# Patient Record
Sex: Female | Born: 1935 | Race: White | Hispanic: No | Smoking: Never smoker
Health system: Southern US, Community
[De-identification: ages and names within clinical notes are randomized; demographics above are authoritative.]

## PROBLEM LIST (undated history)

## (undated) DIAGNOSIS — I10 Essential (primary) hypertension: Secondary | ICD-10-CM

## (undated) DIAGNOSIS — I4891 Unspecified atrial fibrillation: Secondary | ICD-10-CM

## (undated) HISTORY — PX: FEMUR CLOSED REDUCTION: SHX939

---

## 2005-01-12 ENCOUNTER — Emergency Department: Payer: Self-pay | Admitting: Unknown Physician Specialty

## 2005-08-02 ENCOUNTER — Ambulatory Visit: Payer: Self-pay | Admitting: *Deleted

## 2006-01-07 ENCOUNTER — Ambulatory Visit: Payer: Self-pay | Admitting: *Deleted

## 2006-10-25 ENCOUNTER — Ambulatory Visit: Payer: Self-pay | Admitting: *Deleted

## 2007-11-12 ENCOUNTER — Ambulatory Visit: Payer: Self-pay | Admitting: *Deleted

## 2009-09-24 ENCOUNTER — Inpatient Hospital Stay: Payer: Self-pay | Admitting: Internal Medicine

## 2009-09-30 ENCOUNTER — Inpatient Hospital Stay: Payer: Self-pay | Admitting: Internal Medicine

## 2010-01-13 ENCOUNTER — Ambulatory Visit: Payer: Self-pay | Admitting: Ophthalmology

## 2010-01-30 ENCOUNTER — Ambulatory Visit: Payer: Self-pay | Admitting: Ophthalmology

## 2010-02-08 ENCOUNTER — Ambulatory Visit: Payer: Self-pay | Admitting: Otolaryngology

## 2010-05-25 ENCOUNTER — Inpatient Hospital Stay: Payer: Self-pay | Admitting: *Deleted

## 2010-11-08 ENCOUNTER — Ambulatory Visit: Payer: Self-pay | Admitting: Ophthalmology

## 2010-11-13 ENCOUNTER — Ambulatory Visit: Payer: Self-pay | Admitting: Ophthalmology

## 2012-03-21 ENCOUNTER — Emergency Department: Payer: Self-pay | Admitting: Emergency Medicine

## 2013-05-06 ENCOUNTER — Inpatient Hospital Stay: Payer: Self-pay | Admitting: Internal Medicine

## 2013-05-06 LAB — CBC
HCT: 35.1 % (ref 35.0–47.0)
HGB: 12 g/dL (ref 12.0–16.0)
MCH: 32.8 pg (ref 26.0–34.0)
MCHC: 34.2 g/dL (ref 32.0–36.0)
MCV: 96 fL (ref 80–100)
Platelet: 174 10*3/uL (ref 150–440)
WBC: 4.9 10*3/uL (ref 3.6–11.0)

## 2013-05-06 LAB — TSH: Thyroid Stimulating Horm: 1.33 u[IU]/mL

## 2013-05-06 LAB — COMPREHENSIVE METABOLIC PANEL
Alkaline Phosphatase: 70 U/L (ref 50–136)
Anion Gap: 7 (ref 7–16)
BUN: 19 mg/dL — ABNORMAL HIGH (ref 7–18)
Bilirubin,Total: 0.2 mg/dL (ref 0.2–1.0)
Calcium, Total: 8.8 mg/dL (ref 8.5–10.1)
Chloride: 105 mmol/L (ref 98–107)
Co2: 25 mmol/L (ref 21–32)
Glucose: 91 mg/dL (ref 65–99)
Osmolality: 276 (ref 275–301)
Potassium: 3.9 mmol/L (ref 3.5–5.1)
Sodium: 137 mmol/L (ref 136–145)

## 2013-05-06 LAB — MAGNESIUM: Magnesium: 1.9 mg/dL

## 2013-05-06 LAB — TROPONIN I: Troponin-I: 1.3 ng/mL — ABNORMAL HIGH

## 2013-05-06 LAB — PROTIME-INR: Prothrombin Time: 12.4 secs (ref 11.5–14.7)

## 2013-05-06 LAB — CK TOTAL AND CKMB (NOT AT ARMC): CK-MB: 3.8 ng/mL — ABNORMAL HIGH (ref 0.5–3.6)

## 2013-05-07 LAB — CBC WITH DIFFERENTIAL/PLATELET
Basophil #: 0 10*3/uL (ref 0.0–0.1)
Basophil %: 0.7 %
Lymphocyte #: 1 10*3/uL (ref 1.0–3.6)
Lymphocyte %: 23.3 %
MCHC: 34.3 g/dL (ref 32.0–36.0)
Monocyte %: 14.6 %
Neutrophil %: 58 %
Platelet: 162 10*3/uL (ref 150–440)
RBC: 3.27 10*6/uL — ABNORMAL LOW (ref 3.80–5.20)
RDW: 14.3 % (ref 11.5–14.5)

## 2013-05-07 LAB — LIPID PANEL
Cholesterol: 138 mg/dL (ref 0–200)
Ldl Cholesterol, Calc: 68 mg/dL (ref 0–100)
Triglycerides: 59 mg/dL (ref 0–200)
VLDL Cholesterol, Calc: 12 mg/dL (ref 5–40)

## 2013-05-07 LAB — COMPREHENSIVE METABOLIC PANEL
Albumin: 2.7 g/dL — ABNORMAL LOW (ref 3.4–5.0)
Alkaline Phosphatase: 62 U/L (ref 50–136)
Anion Gap: 6 — ABNORMAL LOW (ref 7–16)
BUN: 17 mg/dL (ref 7–18)
Bilirubin,Total: 0.2 mg/dL (ref 0.2–1.0)
Chloride: 105 mmol/L (ref 98–107)
Co2: 28 mmol/L (ref 21–32)
Creatinine: 1.16 mg/dL (ref 0.60–1.30)
Osmolality: 279 (ref 275–301)
SGPT (ALT): 14 U/L (ref 12–78)
Sodium: 139 mmol/L (ref 136–145)

## 2013-05-07 LAB — TROPONIN I: Troponin-I: 0.75 ng/mL — ABNORMAL HIGH

## 2013-09-28 ENCOUNTER — Emergency Department: Payer: Self-pay | Admitting: Emergency Medicine

## 2015-02-26 ENCOUNTER — Ambulatory Visit: Payer: Self-pay | Admitting: Family Medicine

## 2015-04-01 IMAGING — CR DG CHEST 2V
1 series · 2 of 2 positions shown · non-contrast
Comparison: 05/06/2013

CLINICAL DATA: Slight cough today. Crackles within the left lung
base.

EXAM:
CHEST  2 VIEW

[Series 1: w chest pa · 0.14mm/px · 2 of 2 slices shown]
[im 1/2]
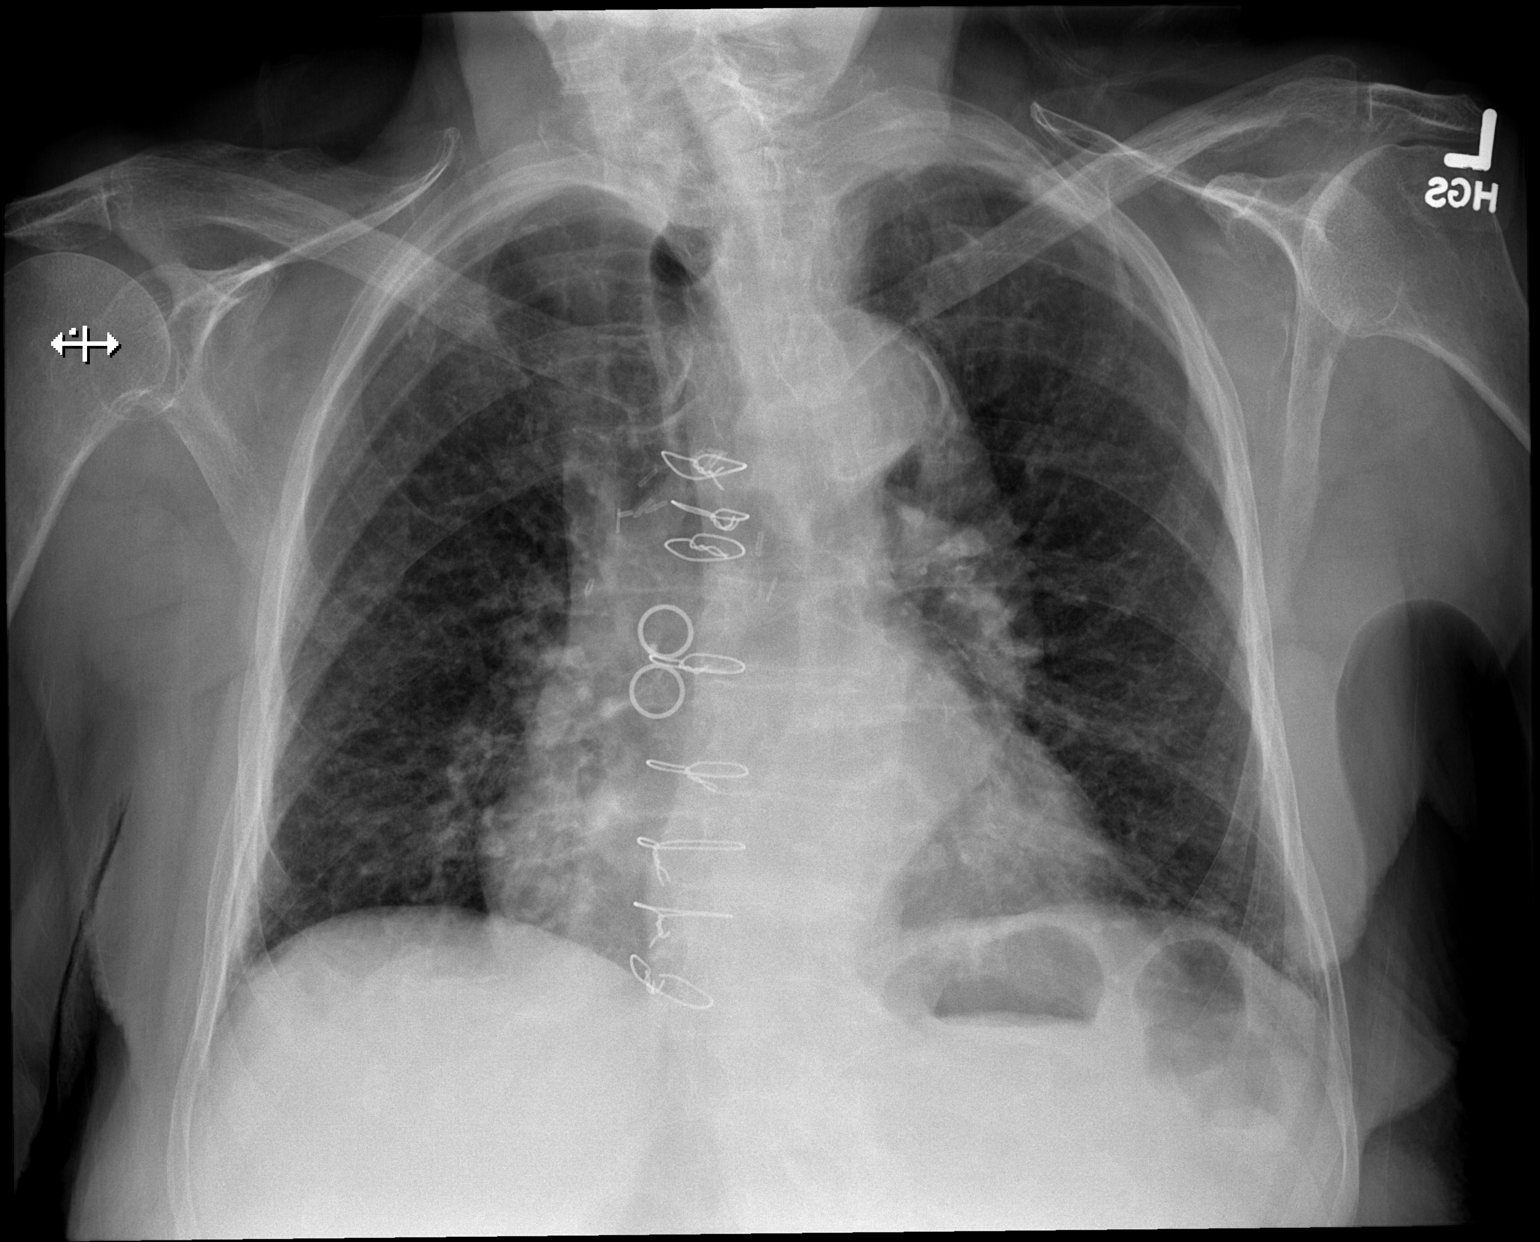
[im 2/2]
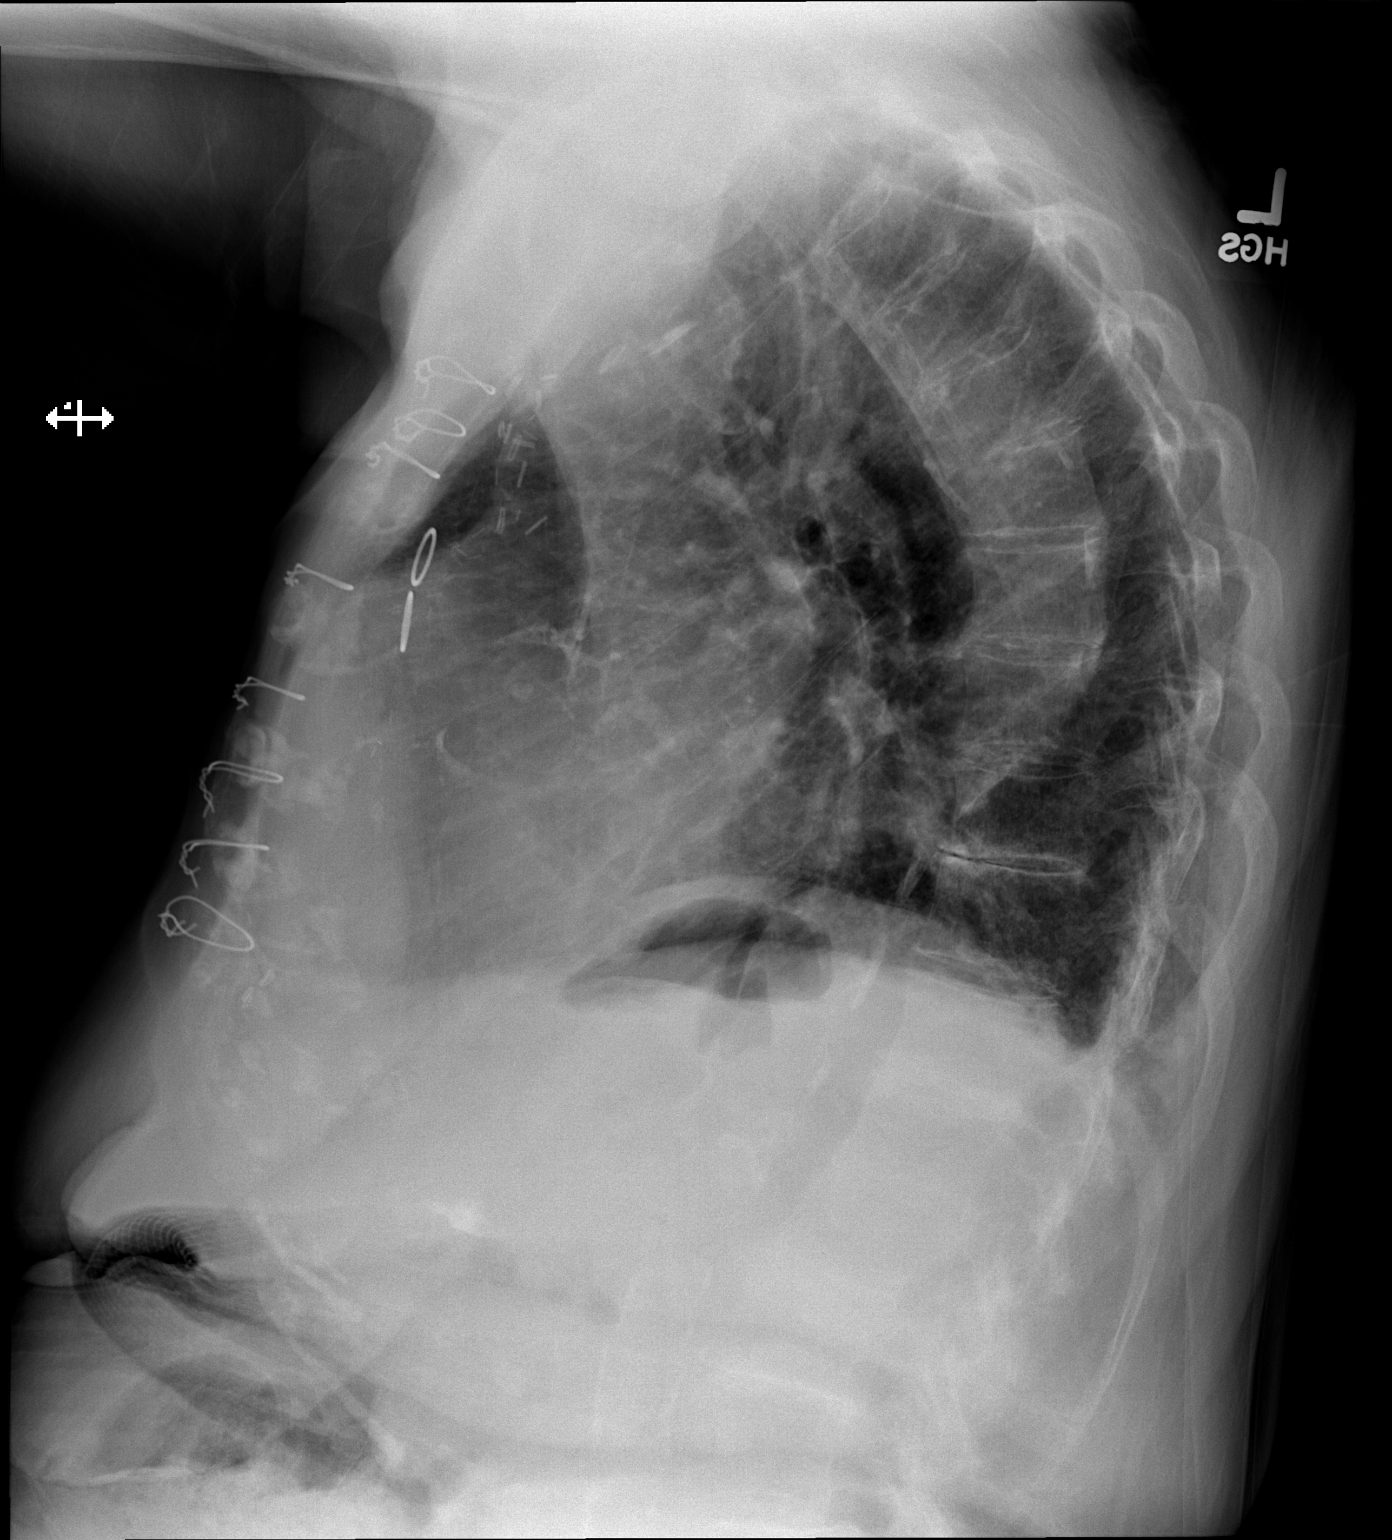

[2 of 2 positions shown; findings below may reference images not displayed]

FINDINGS: Grossly unchanged enlarged cardiac silhouette and mediastinal
contours given reduced lung volumes and patient rotation. Post
median sternotomy and CABG. Atherosclerotic plaque within a tortuous
and possibly ectatic thoracic aorta. Mild pulmonary venous
congestion without frank evidence of edema. Minimal bibasilar
opacities favored to represent atelectasis. No definite pleural
effusion or pneumothorax. Unchanged bones.
IMPRESSION: Cardiomegaly and pulmonary venous congestion without frank evidence
of edema on this hypoventilated examination.

## 2015-04-05 ENCOUNTER — Inpatient Hospital Stay
Admit: 2015-04-05 | Disposition: A | Discharge status: Skilled Nursing Facility | Payer: Self-pay | Attending: Internal Medicine | Admitting: Internal Medicine

## 2015-04-05 LAB — URINALYSIS, COMPLETE
BILIRUBIN, UR: NEGATIVE
Bacteria: NONE SEEN
GLUCOSE, UR: NEGATIVE mg/dL (ref 0–75)
Ketone: NEGATIVE
LEUKOCYTE ESTERASE: NEGATIVE
NITRITE: NEGATIVE
PH: 5 (ref 4.5–8.0)
Protein: NEGATIVE
Specific Gravity: 1.012 (ref 1.003–1.030)

## 2015-04-05 LAB — BASIC METABOLIC PANEL
ANION GAP: 3 — AB (ref 7–16)
Anion Gap: 5 — ABNORMAL LOW (ref 7–16)
BUN: 20 mg/dL
BUN: 19 mg/dL
Calcium, Total: 8.5 mg/dL — ABNORMAL LOW
Calcium, Total: 7.4 mg/dL — ABNORMAL LOW
Chloride: 98 mmol/L — ABNORMAL LOW
Chloride: 100 mmol/L — ABNORMAL LOW
Co2: 31 mmol/L
Co2: 31 mmol/L
Creatinine: 1.31 mg/dL — ABNORMAL HIGH
Creatinine: 1.17 mg/dL — ABNORMAL HIGH
EGFR (Non-African Amer.): 39 — ABNORMAL LOW
GFR CALC AF AMER: 39 — AB
GFR CALC AF AMER: 45 — AB
GFR CALC NON AF AMER: 34 — AB
GLUCOSE: 135 mg/dL — AB
Glucose: 130 mg/dL — ABNORMAL HIGH
Potassium: 3 mmol/L — ABNORMAL LOW
Potassium: 3.6 mmol/L
SODIUM: 134 mmol/L — AB
Sodium: 134 mmol/L — ABNORMAL LOW

## 2015-04-05 LAB — CBC
HCT: 34 % — AB (ref 35.0–47.0)
HGB: 11.2 g/dL — ABNORMAL LOW (ref 12.0–16.0)
MCH: 31.7 pg (ref 26.0–34.0)
MCHC: 33 g/dL (ref 32.0–36.0)
MCV: 96 fL (ref 80–100)
PLATELETS: 160 10*3/uL (ref 150–440)
RBC: 3.54 10*6/uL — ABNORMAL LOW (ref 3.80–5.20)
RDW: 14.4 % (ref 11.5–14.5)
WBC: 7.5 10*3/uL (ref 3.6–11.0)

## 2015-04-05 LAB — PROTIME-INR
INR: 1
Prothrombin Time: 13.4 secs

## 2015-04-05 LAB — MAGNESIUM: Magnesium: 2.1 mg/dL

## 2015-04-05 LAB — APTT: ACTIVATED PTT: 25.7 s (ref 23.6–35.9)

## 2015-04-06 LAB — CBC WITH DIFFERENTIAL/PLATELET
Basophil #: 0 10*3/uL (ref 0.0–0.1)
Basophil %: 0.1 %
Eosinophil #: 0 10*3/uL (ref 0.0–0.7)
Eosinophil %: 0.3 %
HCT: 25 % — AB (ref 35.0–47.0)
HGB: 8.3 g/dL — ABNORMAL LOW (ref 12.0–16.0)
Lymphocyte #: 0.5 10*3/uL — ABNORMAL LOW (ref 1.0–3.6)
Lymphocyte %: 5 %
MCH: 32.6 pg (ref 26.0–34.0)
MCHC: 33.3 g/dL (ref 32.0–36.0)
MCV: 98 fL (ref 80–100)
MONO ABS: 0.8 x10 3/mm (ref 0.2–0.9)
MONOS PCT: 8.2 %
NEUTROS ABS: 8.7 10*3/uL — AB (ref 1.4–6.5)
Neutrophil %: 86.4 %
Platelet: 130 10*3/uL — ABNORMAL LOW (ref 150–440)
RBC: 2.56 10*6/uL — ABNORMAL LOW (ref 3.80–5.20)
RDW: 14.2 % (ref 11.5–14.5)
WBC: 10 10*3/uL (ref 3.6–11.0)

## 2015-04-06 LAB — BASIC METABOLIC PANEL
ANION GAP: 5 — AB (ref 7–16)
BUN: 19 mg/dL
CALCIUM: 8 mg/dL — AB
CHLORIDE: 102 mmol/L
CO2: 28 mmol/L
Creatinine: 1.12 mg/dL — ABNORMAL HIGH
GFR CALC AF AMER: 47 — AB
GFR CALC NON AF AMER: 41 — AB
Glucose: 146 mg/dL — ABNORMAL HIGH
Potassium: 3.6 mmol/L
Sodium: 135 mmol/L

## 2015-04-07 LAB — HEMOGLOBIN: HGB: 7.5 g/dL — ABNORMAL LOW (ref 12.0–16.0)

## 2015-04-07 LAB — PLATELET COUNT: Platelet: 137 10*3/uL — ABNORMAL LOW (ref 150–440)

## 2015-04-08 LAB — CBC WITH DIFFERENTIAL/PLATELET
Basophil #: 0 10*3/uL (ref 0.0–0.1)
Basophil %: 0.1 %
EOS PCT: 0 %
Eosinophil #: 0 10*3/uL (ref 0.0–0.7)
HCT: 24 % — ABNORMAL LOW (ref 35.0–47.0)
HGB: 7.8 g/dL — ABNORMAL LOW (ref 12.0–16.0)
Lymphocyte #: 0.4 10*3/uL — ABNORMAL LOW (ref 1.0–3.6)
Lymphocyte %: 3.8 %
MCH: 31.7 pg (ref 26.0–34.0)
MCHC: 32.6 g/dL (ref 32.0–36.0)
MCV: 97 fL (ref 80–100)
MONO ABS: 0.8 x10 3/mm (ref 0.2–0.9)
Monocyte %: 8.4 %
NEUTROS ABS: 8.8 10*3/uL — AB (ref 1.4–6.5)
Neutrophil %: 87.7 %
Platelet: 173 10*3/uL (ref 150–440)
RBC: 2.46 10*6/uL — ABNORMAL LOW (ref 3.80–5.20)
RDW: 14.6 % — ABNORMAL HIGH (ref 11.5–14.5)
WBC: 10 10*3/uL (ref 3.6–11.0)

## 2015-04-08 NOTE — Discharge Summary (Signed)
PATIENT NAME:  Emily Henry, Emily Henry MR#:  [id number] DATE OF BIRTH:  11/01/1915  DATE OF ADMISSION:  05/06/2013 DATE OF DISCHARGE:  05/07/2013  ADMITTING DIAGNOSIS: Atrial fibrillation, new diagnosis.  DISCHARGE DIAGNOSES:  1.  Atrial fibrillation, rapid ventricular response, converted to sinus rhythm now. Suspected paroxysmal atrial fibrillation.  2.  Elevated troponin, likely due to atrial fibrillation. Per cardiology, not myocardial infarction.  3.  Hypertension. 4.  Hyperlipidemia with LDL of 68.  5.  Generalized weakness.  6.  Valvular heart disease.  Echo this admission showed mild left ventricular hypertrophy, mild mitral regurgitation, and moderate to severe tricuspid regurgitation. Grade I diastolic dysfunction.   DISCHARGE CONDITION: Stable.   DISCHARGE MEDICATIONS: The patient is to resume her outpatient medications which are:  1.  Nexium 20 p.o. daily. 2.  Pravastatin 20 mg p.o. at bedtime. 3.  Iron sulfate 325 mg p.o. twice daily. 4.  Vitamin D2 50,000 one capsule once weekly. 5.  Enalapril 20 mg p.o. twice daily, new medication. 6.  Amiodarone 400 mg p.o. once daily. 7.  Metoprolol 25 mg p.o. twice daily. 8.  Latanoprost 0.005% ophthalmic solution 1 drop to each affected eye at bedtime. 9.  Brimonidine ophthalmic solution 1 drop to each affected eye once at bedtime.   HOME OXYGEN: None.   DIET: 2 gram salt, low fat, low cholesterol, mechanical soft.   ACTIVITY LIMITATIONS: As tolerated.    DISCHARGE FOLLOWUP:  Appointment with Dr. Adrian Blackwater on Thursday of next week, in the next 1 to 2 days, as well as [facility] in 2 to 3 days after discharge.  CONSULTANTS: Care management, Dr. Adrian Blackwater, and Ms. [facility] [place].   RADIOLOGIC STUDIES: Chest x-ray, PA and lateral, on 21st of May 2014, showed no evidence of CHF or pneumonia or other acute cardiopulmonary abnormality.  Nodularity in the retrocardiac region, on the lateral firm, likely reflects (Dictation Anomaly)  a pulmonary vesel on end, but a parenchymal nodule is not absolutely excluded. Follow-up PA and lateral with deep inspiration would be of value, according to radiology.  Echocardiogram, on 21st of May 2014, showed left ventricular ejection fraction by visual estimation of 65% to 70%, mild left ventricular hypertrophy, decreased left ventricular internal cavity size, moderately dilated left atrium, mild mitral valve regurgitation, moderate to severe tricuspid regurgitation, and grade I diastolic dysfunction.   HISTORY OF PRESENT ILLNESS:  The patient is 81 year old Caucasian female with past medical history significant for history of hypertension as well as hyperlipidemia who presented to the hospital with palpitations. Please refer to Dr. Arlys John admission note on the 21st of May 2014.   On arrival to the hospital, the patient's temperature was 97.6, pulse was 80, respiration rate was 20, blood pressure 136/64, and saturation was 96% on room air. Physical examination was unremarkable.   The patient's EKG showed A-fib with rate of 96, anteroseptal infarct, age indeterminant, and no acute ST-T changes were noted. The patient's lab data done on the 21st of May 2014 revealed mild elevation of BUN to 19, otherwise BMP was unremarkable. The patient's liver enzymes revealed albumin level of 3.1, otherwise unremarkable. First set of cardiac enzymes revealed mild elevation of troponin to 0.16 and second set 1.3 with CK-MB fraction elevation to 3.8.  On the third set, normal MB fraction and troponin level was 0.75. TSH was normal at 1.33. White blood cell count was 4.9, hemoglobin 12.0, and platelet count 174. Coagulation panel was unremarkable. The patient's chest x-rays were unremarkable.  HOSPITAL COURSE:  The patient was admitted to the hospital for further evaluation, for observation. Her cardiac enzymes were cycled and she was started on beta blocker, metoprolol. The [facility] as well as Dr. Adrian BlackwaterShaukat Khan. Dr. Welton FlakesKhan, cardiologist, felt the patient had A-fib with rapid ventricular response. The patient has a history of paroxysmal atrial fibrillation which usually resolves with rest; however, it was persistent on the day of admission and converted to sinus rhythm with Cardizem IV. As the patient had  CHADS score of 2, cardiology recommended long-term anticoagulation in the future. Due to the fact the patient was having upcoming surgery on the arm, we deferred starting this until after surgery is completed. The patient was started on antiarrhythmic medication, amiodarone 400 mg once daily dose, with the plan of tapering it to 200 mg daily.  In regards to coronary artery disease, the patient has a history of coronary artery bypass grafting x 3. She had no chest pains or chest pressure or jaw pain; however, had some dyspnea on exertion.  Her troponin was elevated and cardiology felt it could be related to A-fib or anemia versus coronary ischemia. They recommended echocardiogram to check for wall motion abnormality, also to rule out any thrombus. This was performed and no wall motion abnormalities were found or any thrombus was found, as mentioned above.   In regards to hypertension, the patient's blood pressure was noted to be somewhat elevated.  They recommended to start the patient on amlodipine in addition to ACE inhibitor as well as beta blocker.   In regards to hyperlipidemia, the patient needs to continue statin and LDL was felt to be at goal.  In regards to hypertension, the patient was started on beta blocker and her ACE inhibitor was advanced.  The patient's blood pressure somewhat improved, however, not significantly so. By the day of discharge, the 22nd of May 2014, the patient's temperature was normal at 98, pulse was 66, respiration rate was 16 to 18, and blood pressure still ranging from 150s to 160s systolic and 70s to 80s diastolic. O2 sats were 96% to 97% on room  air at rest. It is recommended to follow the patient's blood pressure readings as outpatient and make decisions about advancement of her blood pressure medications even further if needed.   The patient is being discharged in stable condition with above-mentioned medications and follow-up. Of note, the patient had comparison basic metabolic panel done which was unremarkable. The patient also had CBC repeated on the 27th of May 2014 which revealed some anemia with hemoglobin level of 10.8. It is recommended to follow the patient's hemoglobin level as needed. No active bleeding was noted on the day of discharge.   TIME SPENT: 40 minutes. ____________________________ Katharina Caperima Mckenleigh Tarlton, MD rv:sb D: 05/08/2013 14:06:13 ET T: 05/08/2013 15:25:35 ET JOB#: [id number]  cc: Katharina Caperima York Valliant, MD, <Dictator> [facility] Shoua Ressler MD ELECTRONICALLY SIGNED 05/24/2013 18:18

## 2015-04-08 NOTE — Consult Note (Signed)
Brief Consult Note: Diagnosis: a fib wth RVR.   Patient was seen by consultant.   Consult note dictated.   Orders entered.   Comments: Patient with palpitations, fast HR, weakness and found to have a fib with RVR. Patient apparently has h/o paroxysmal afib, h/o CAD, HTN, valvular disease. She converted to NSR with Cardizem and  denies CP, chest pressure, jaw pain at this time. TNI elevated, but could be from demand ischemia from tachycardia/a fib vs. coronary ischemia. Will get echo to check for wall motion abnormalities before proceeding with more invasive testing and patient denies CP, chest pressure, and is largely asymptomatic at this time. Since she has CHADS2 score of 2 and does not have any fall history, would recommend long-term anticoagulation therapy. (she does have surgery planned for next week). Agree with heparin, ACE-I, statin, and will start anti-arrhythmics.  Electronic Signatures: Radene KneeKhan, Shaukat Ali (MD)   (Signed 22-May-14 09:02)  Co-Signer: Brief Consult Note Verta EllenManzi, Theresea Trautmann A (PA-C)   (Signed 22-May-14 08:17)  Authored: Brief Consult Note  Last Updated: 22-May-14 09:02 by Radene KneeKhan, Shaukat Ali (MD)

## 2015-04-08 NOTE — Consult Note (Signed)
PATIENT NAME:  Emily Henry, Emily Henry MR#:  [id number] DATE OF BIRTH:  [birth year]  DATE OF CONSULTATION:  05/07/2013  REFERRING PHYSICIAN:  Katharina Caper, MD CONSULTING PHYSICIAN:  Verta Ellen, PA-C  PRIMARY CARE PHYSICIAN: [facility].  REASON FOR CONSULTATION: Atrial fibrillation with rapid ventricular response.   HISTORY OF PRESENT ILLNESS: Ms. Emily Henry is a pleasant 81 year old white female who lives independently on her own. She has a history of hypertension, coronary artery disease requiring coronary artery bypass grafting x 3 (10 years ago at [facility]), history of hyperlipidemia, valvular heart disease, and possible transient ischemic attack during admission in June 2011. The patient states she has had intermittent palpitations and rapid heart rate which usually resolve with rest. Yesterday, however, she complained of her heart racing and her symptoms would not go away. She was brought to the Emergency Department where she was found to have atrial fibrillation with rapid ventricular response. She denied any chest pain, chest pressure, heaviness, jaw pain, orthopnea, coughing, wheezing, or fainting. She has some chronic shortness of breath that is worse with exertion, but denies any orthopnea or significant leg edema. She received diltiazem and converted into normal sinus rhythm.   PAST MEDICAL HISTORY: 1.  Transient ischemic attack per report from June 2011.  2.  Hypertension.  3.  Coronary artery disease requiring coronary artery bypass grafting x 3 at [facility] approximately 10 years ago.  4.  Hyperlipidemia.  5.  Valvular heart disease (the patient thinks it may be tricuspid regurgitation).  6.  Anemia, the patient is currently on iron supplementation.  7.  Gastroesophageal reflux disease with history of hiatal hernia.  8.  Glaucoma.  9.  Basal cell carcinoma on the left forearm (the patient is scheduled for removal next week).    PAST SURGICAL HISTORY: 1.  Coronary artery bypass grafting x 3 ten years ago at [facility].   2.  Skin cancer on the left side of the face. 3.  Eye surgery.  4.  Appendectomy.   ALLERGIES: No known drug allergies.   HOME MEDICATIONS: 1.  Amlodipine 5 mg p.o. daily.  2.  Celebrex 200 mg p.o. daily.  3.  Enalapril 10 mg p.o. at bedtime.  4.  Aspirin 81 mg p.o. daily.  5.  Iron sulfate 325 mg p.o. b.i.d.  6.  Nexium 20 mg 1 tablet p.o. daily.  7.  Atorvastatin 10 mg p.o. daily.  8.  Vitamin D 50,000 units once weekly.   SOCIAL HISTORY: The patient lives independently and has family members that live nearby. She is widowed. Denies any smoking, alcohol, or illicit drug use.   FAMILY HISTORY: Mother with congestive heart failure.  Son with basal cell carcinoma and hypertension. Father with kidney failure.   REVIEW OF SYSTEMS: GENERAL: The patient complains of weakness and fatigue.  HEENT: The patient complains of blurring vision, uses bifocal glasses, has seasonal allergies with sneezing.  PULMONARY: The patient complains of shortness of breath that is worse with exertion.  CARDIOVASCULAR: The patient complains of palpitations and rapid heart rate.  GASTROINTESTINAL: The patient denies any nausea and vomiting. However, she does have some history of gastroesophageal reflux disease.  PHYSICAL EXAMINATION: GENERAL: This is a pleasant female who is not in any acute distress. She is alert and oriented x 3.  Able to answer questions appropriately.  VITAL SIGNS: Temperature 98 degrees Fahrenheit, heart rate 66, respiratory rate 18, blood pressure 160/75, and O2 sat is  97% on room air.  HEENT:  Head:  atraumatic, normocephalic. Eyes:  Pupils are round and equal, pale conjunctivae. Ears and nose:  Are normal to external inspection. Mouth:  Moist mucous membranes.  NECK: Supple. Trachea is midline. Thyroid smooth. No carotid bruits.  LUNGS: Clear to auscultation bilaterally,  no adventitious breath sounds, no accessory muscle use.  HEART:  Regular rate and rhythm, has a murmur loudest over the apex that is systolic, grade 4/6.   ABDOMEN: Soft and nontender. Bowel sounds are present in all 4 quadrants. No hepatosplenomegaly.  EXTREMITIES: No cyanosis, clubbing, or edema.   ANCILLARY DATA: EKG on admission with atrial fibrillation, 94 beats per minute, nonspecific ST and T wave changes.   Chest x-ray:  No definite evidence of CHF or pneumonia, nodularity in the retrocardiac region on the lateral film that could be pulmonary vessel versus parenchymal nodule.   LABORATORY DATA: Glucose 89, BUN 17, creatinine 1.16, sodium 139, potassium 4.1, chloride 105, and CO2 28.  Total cholesterol 138, triglycerides 59, HDL 58, and LDL 68.  Total protein 6.3, albumin 2.7, total bilirubin 0.2, alkaline phosphatase 62, AST 27, and ALT 17. Total CK 191, CK-MB 2.7, troponin I were 0.16, 1.30, and 0.75. TSH is 1.33. White blood cell count 4.3, hemoglobin 10.8, hematocrit 31.5, and platelet count 162,000. PTT is greater than 160. PT 12.4 and INR 0.9.   ASSESSMENT AND PLAN: 1.  Atrial fibrillation with rapid ventricular response. The patient has a history of paroxysmal atrial fibrillation which usually resolves with rest, however, it was persistent yesterday and converted to normal sinus rhythm with Cardizem. She is currently in normal sinus rhythm. She has a CHADS2 score of 2 and would recommend long-term anticoagulation in the future. Due to the fact that the patient is having upcoming surgery on her arm, may want to defer starting this till surgery is completed. Will start the patient on antiarrhythmic, amiodarone 400 mg q. day with the plan of tapering it to 200 mg daily.  2.  Coronary artery disease. The patient has a history of coronary artery disease status post coronary artery bypass grafting x 3. She denies any chest pain, chest pressure, or jaw pain, but does have some dyspnea on  exertion. Troponin I elevated, but this could be secondary to demand ischemia from tachycardia secondary to atrial fibrillation or anemia versus coronary ischemia. We will get echocardiogram to check wall motion abnormality and also to rule out any thrombi.  3.  Hypertension. The patient's blood pressure is elevated.  Will restart amlodipine, in addition to her ACE inhibitor and beta blocker.  4.  Hyperlipidemia. The patient is currently on statin and LDL is at goal.   Thank you very much for allowing us to participate in this patient's care. We will continue to follow this patient with you. ____________________________ Verta EllenMonica A. Idali Lafever, PA-C mam:sb D: 05/07/2013 08:34:37 ET T: 05/07/2013 08:48:47 ET JOB#: [id number]  cc: Verta EllenMonica A. Uzziah Rigg, PA-C, <Dictator> [facility] Katharina Caperima [place], MD Herman Fiero A [facility] PA ELECTRONICALLY SIGNED 05/07/2013 11:12

## 2015-04-08 NOTE — H&P (Signed)
PATIENT NAME:  Emily FilbertBOWLAND, Glanda F MR#:  [id number] DATE OF BIRTH:  07/09/1915  DATE OF ADMISSION:  05/06/2013  PRIMARY CARE PHYSICIAN: [facility]   CARDIOLOGIST: The patient is not sure if Dr. Juel BurrowMasoud or Dr. Adrian BlackwaterShaukat Khan.   The patient is a 81 year old Caucasian female with past medical history significant for history of hypertension, labile hypertension, history of coronary artery disease, history of hyperlipidemia, valvular heart disease, a history of transient ischemic attack admission in June 2011, who presents to the hospital with complaints of palpitations.  Per the patient, she was doing well until approximately 2 or 3 days ago when she started having morning time sickness.  She states that she wakes up and then after she wakes up around 5 or 6:00 a.m. she usually does not feel well. She feels very weak, very tired and she feels that she has some palpitations. She had to sit down to regain some strength and usually those palpitations disappear in the next few hours after she takes her medications.  Today, however, she was having some shortness of breath as well as some chest tightness, so she decided to come to the Emergency Room for further evaluation. In the Emergency Room, she was found to be in atrial fibrillation.  Her heart rate was 94 reported on EKG; however, reported atrial fibrillation, RVR according to the Emergency Room physician on flow sheet and she was noted to have heart rate of 80; however, she received 5 mg of Cardizem after to her usual home medications including amiodarone, she is back in sinus rhythm. She feels quite comfortable now. She denies any chest pains.   PAST MEDICAL HISTORY: Significant for history of admission in June 2011 with transient ischemic attack, also a history of labile hypertension, malignant hypertension, coronary artery disease. Hyperlipidemia. Valvular heart disease and, status post coronary artery bypass graft surgery approximately 10 years ago ,  glaucoma and postnasal drip and also recent diagnosis of basal cell carcinoma in the left forearm biopsy done approximately a week ago. The patient is scheduled for removal of her basal carcinoma.   PAST SURGICAL HISTORY: Coronary artery bypass grafting, cancer in the left side of the face removal, and eye surgery.   ALLERGIES: No known drug allergies.   MEDICATIONS: According to medical records, the patient is on amlodipine 5 mg p.o. daily, Celebrex 200 mg p.o. daily, enalapril 10 mg p.o. at bedtime, iron sulfate 325 mg p.o. twice daily, Nexium 20 mg p.o. once daily, atorvastatin 10 mg p.o. daily, vitamin D 50,000 units once weekly.  In the past, she was also on Xalatan at bedtime, as well as (Dictation Anomaly)Alphagan to both eyes  twice daily.   SOCIAL HISTORY: No smoking or alcohol. She lives by herself at home. She is widowed.   FAMILY HISTORY: The patient's mother died at age of 79 with multiple medical problems. The patient's father lived up until his 7790s and died of kidney failure.  Mother had congestive heart failure, also glaucoma in family.  Basal cell carcinoma in patient's son and hypertension in multiple family members.   SOCIAL HISTORY: The patient is widowed, has three children who live close by. No alcohol abuse. No smoking. She used to work in a [employer] for 40 years.  REVIEW OF SYSTEMS:  Positive for feeling cold for a while now, fatigue and weak for the past few months.  (Dictation Anomaly)Per  the patient's son, she had some blurring of vision, she is on the border of glaucoma, also  bifocal glasses. She has some seasonal allergies. Some sneezing intermittently and  shortness of breath today as well as palpitations earlier today. Admits of having some lower extremity edema around her ankles at the end of the day; however, the patient's swelling dissipates in the morning.  Admits to having some achiness, for the past few days, although the patient's son tells me that she has  been weak for the past few months now. Admits of having some fluttering. She has to sit down. And that suddenly goes away in the next few hours. She wakes up usually at 6:00 a.m. and then her fluttering dissipates at around 10:00 a.m.  She admits of having some heaviness in the chest as well as some shortness of breath. Admits of having some pain between her shoulder blades intermittently.  Admits to having some nausea here in the emergency room.  Also, urinary urge incontinence and needs to wake up at nighttime to urinate approximately 1 to 2 times at night.   CONSTITUTIONAL: Denies any fevers, pains, weight loss or gain.   EYES: Denies any double vision denies any tinnitus, allergies, epistaxis, sinus tenderness or difficulty swallowing. Respiratory, no cough, wheezes, asthma or (Dictation Anomaly)COPD  . Admits of having some pains between shoulder blades.   GI,   denies any vomiting, diarrhea or constipation.   GENITOURINARY: Denies any dysuria, hematuria, frequency.   ENDOCRINOLOGY:No polydispia, polyuria  She is not sure about heat  intolerance or thirst.   HEMATOLOGIC: Denies anemias, (Dictation Anomaly)easy bruising, bleeding.  SKIN:  Denies any acne, rashes, lesions or change in moles.  Admits to having some growth on the left forearm for which biopsy is done and she is scheduled for removal of this growth which was diagnosed as basal cell carcinoma. Denies any arthritis, cramps, swelling, gout.   NEUROLOGIC: No numbness, epilepsy or tremor.   PSYCHIATRIC: Denies anxiety, insomnia or depression.   PHYSICAL EXAMINATION: VITAL SIGNS: On arrival to the hospital, temperature is 97.6, pulse was 80, respiratory rate was 20, blood pressure 136/64, saturation was 96% on room air.   GENERAL: This is a well-developed, well-nourished, thin, frail, Caucasian female in no significant distress sitting on the stretcher.  Pupils are equal, reactive to light.  Extraocular muscles intact.  No icterus  or conjunctivitis. Has normal hearing. No pharyngeal erythema. Mucosa is moist.   NECK: No masses. Supple, nontender. Thyroid is not enlarged. No adenopathy. No JVD or carotid bruits.  Good range of motion.   LUNGS: Clear to auscultation all fields. No rales or findings of diminished breath sounds or wheezing.  Nonlabored respirations.  (Dictation Anomaly)No dullness  to percussion or acute respiratory distress.   CARDIOVASCULAR: S1, S2 appreciated. Murmur was heard approximately 4 out of 6 in the precordial area radiating to her left  axilla.  PMI not lateralized. Chest is nontender to palpitation. Diminished pedal pulses.  No lower extremity edema, calf tenderness or cyanosis was noted.  The patient's rhythm was regular.   ABDOMEN: Soft, nontender. Bowel sounds are present. No hepatosplenomegaly or masses were noted.   RECTAL: Deferred.   MUSCLE STRENGTH: Able to move all extremities. No cyanosis. The patient does have significant kyphosis. Gait is not tested.   SKIN: Revealed malignant looking growth on the left forearm, which also had some bruising around it.  No nodularity or erythema was noted. The patient does have brownish discoloration in her lower extremities as well as thinning of her skin; however, no other wounds or lesions were noted.  LYMPHATIC: No adenopathy in the cervical region.   NEUROLOGICAL: Cranial nerves grossly intact. Sensory is intact. No dysarthria.  The patient is alert to time, person, place, cooperative.  Memory good.  No confusion, agitation or depression noted.   LABORATORY DATA: BMP showed elevated BUN of 19, glucose 91, otherwise BMP was unremarkable. Magnesium level was normal at 1.9. Liver enzymes: Albumin level of 3.1, otherwise unremarkable. Cardiac enzymes were normal except for troponin level is mildly elevated at 0.16. TSH was normal at 1.33.  White blood cell count is normal at 4.9, hemoglobin 12.0, platelet count 174. Coagulation panel was  unremarkable. EKG showed atrial fibrillation at a rate of 94 beats per minute, anteroseptal infarct age indeterminant, no acute ST-T changes, however, were noted.   RADIOLOGIC STUDIES: Chest x-ray, PA and lateral, on 05/06/2013 showed no definite evidence of congestive heart failure or pneumonia or other acute cardiopulmonary abnormality. Nodularity in the retrocardiac region on the lateral film likely reflects pulmonary vessels at the end with a parenchymal nodule is not absolutely excluded. Follow up PA and lateral with deep inspirations would be of value, according to radiologist.   ASSESSMENT AND PLAN: 1. Atrial fibrillation, admit the patient to the medical floor. Start her on metoprolol.  We'll be watching her blood pressure very carefully. We will also get cardiologist involved and we are not going to initiate amiodarone at this time yet.   2. (Dictation Anomaly)Elevated  troponin with chest tightness, shortness of breath could be acute coronary syndrome (Dictation Anomaly)equivalent  . The patient is status post coronary artery bypass grafting; however, that was approximately 10 years ago. The patient has not had recent cardiac evaluation.  A cardiology consultation will be requested and start beta blockers, aspirin, nitroglycerin as well as heparin IV for now. We will check cardiac enzymes x 3. We will get echocardiogram done as well as lipid panel.  3. Hypertension. Will add beta blockers, continue ACE inhibitors at nighttime.   4. Basal cell skin carcinoma from left forearm. The patient is scheduled for removal next week.  5. Hyperlipidemia. Will check lipid panel in the morning. We will continue outpatient medications.   TIME SPENT: 50 minutes    ____________________________ Katharina Caper, MD rv:[id number] D: 05/06/2013 13:19:52 ET T: 05/06/2013 14:12:33 ET JOB#: [id number]  cc: Katharina Caper, MD, <Dictator> [facility]  Demetries Coia MD ELECTRONICALLY SIGNED 05/24/2013 18:54

## 2015-04-11 DIAGNOSIS — I240 Acute coronary thrombosis not resulting in myocardial infarction: Secondary | ICD-10-CM

## 2015-04-11 DIAGNOSIS — I10 Essential (primary) hypertension: Secondary | ICD-10-CM | POA: Diagnosis not present

## 2015-04-11 DIAGNOSIS — I480 Paroxysmal atrial fibrillation: Secondary | ICD-10-CM | POA: Diagnosis not present

## 2015-04-11 DIAGNOSIS — S7292XD Unspecified fracture of left femur, subsequent encounter for closed fracture with routine healing: Secondary | ICD-10-CM | POA: Diagnosis not present

## 2015-04-11 DIAGNOSIS — I2510 Atherosclerotic heart disease of native coronary artery without angina pectoris: Secondary | ICD-10-CM | POA: Diagnosis not present

## 2015-04-13 DIAGNOSIS — T8189XA Other complications of procedures, not elsewhere classified, initial encounter: Secondary | ICD-10-CM | POA: Diagnosis not present

## 2015-04-13 DIAGNOSIS — S72012A Unspecified intracapsular fracture of left femur, initial encounter for closed fracture: Secondary | ICD-10-CM | POA: Diagnosis not present

## 2015-04-17 NOTE — H&P (Signed)
PATIENT NAME:  Emily Henry, Emily Henry MR#:  [id number] DATE OF BIRTH:  [birth year]  DATE OF ADMISSION:  04/05/2015  CHIEF COMPLAINT:  Fall.   HISTORY OF PRESENT ILLNESS:  This is a 81 year old female who had an unwitnessed fall at home. She was by herself. She states that she got up and went to the bathroom and was coming back to bed, went to get in the bed, fell, and fractured her left hip. She was brought in to the ED for evaluation and was found to have the left hip fracture. Orthopedics was called and said that they would see her later this morning to evaluate her for treatment for that fracture, so the hospitalists were called for admission. The patient states that she currently is not hurting. She was treated with some morphine in the ED and that is controlling her pain at this time.   PRIMARY CARE PHYSICIAN:  Laurier NancyShaukat A. Khan, MD   PAST MEDICAL HISTORY:  Hypertension, atrial fibrillation, CAD status post MI and CABG, irritable bowel syndrome, skin cancer of the face, heart murmur, hyperlipidemia, arthritis, TIA.   CURRENT MEDICATIONS:  Vitamin D 50,000 units weekly, pravastatin 20 mg daily, latanoprost eyedrops daily, Lasix 20 mg daily, enalapril 10 mg b.i.d., Celebrex 200 mg daily, aspirin 81 mg daily, amiodarone 200 mg daily, brimonidine eyedrops daily.   PAST SURGICAL HISTORY:  Coronary artery bypass grafting.   ALLERGIES:  No known drug allergies.   FAMILY HISTORY:  Hypertension, kidney disease, congestive heart failure.   SOCIAL HISTORY:  The patient is a nonsmoker. Denies alcohol or illicit drug use.   REVIEW OF SYSTEMS:  CONSTITUTIONAL:  Denies fever, fatigue, or weakness.  EYES:  Denies blurred or double vision, pain, or redness.  EAR, NOSE, AND THROAT:  Denies ear pain. Has chronic hearing loss. Denies difficulty swallowing.  RESPIRATORY:  Denies cough, dyspnea, or painful respirations.  CARDIOVASCULAR:  Denies chest pain, edema, or palpitations.  GASTROINTESTINAL:  Denies  nausea, vomiting, diarrhea, abdominal pain, or constipation.  GENITOURINARY:  Denies dysuria, hematuria, or frequency.  ENDOCRINE:  Denies nocturia, thyroid problems, or heat or cold intolerance.  HEMATOLOGIC AND LYMPHATIC:  Denies easy bruising or bleeding or swollen glands.  INTEGUMENTARY:  Denies acne, rash, or lesion.  MUSCULOSKELETAL:  Denies acute arthritis, joint swelling, or gout. Does endorse left hip joint pain and fracture there.  NEUROLOGICAL:  Denies numbness, weakness, or headache.  PSYCHIATRIC:  Denies anxiety, insomnia, or depression.   PHYSICAL EXAMINATION: VITAL SIGNS:  Blood pressure 131/53, pulse 59, temperature 97.7, respiratory rate 12 with 94% O2 saturations on 2 liters of supplemental oxygen.  GENERAL:  This is a very frail, elderly-appearing woman lying supine in bed in no acute distress.  HEENT:  Pupils are equal, round, and reactive to light and accommodation. Extraocular movements are intact. No scleral icterus. Moist mucosal membranes.  NECK:  Thyroid is not enlarged. Neck is supple. No masses. Nontender. No cervical adenopathy. No JVD.  RESPIRATORY:  Clear to auscultation bilaterally. No rales, rhonchi, or wheezing. No respiratory distress.  CARDIOVASCULAR:  Regular rate and rhythm. She has a 2/6 systolic murmur heard loudest at the right upper sternal border. Good pedal pulses. No lower extremity edema.  ABDOMEN:  Soft, nontender, and nondistended with good bowel sounds.  MUSCULOSKELETAL:  She has full spontaneous range of motion throughout except for her left lower extremity, where she has a hip fracture. She has 5/5 muscular strength in the other 3 extremities. No cyanosis or clubbing. The left lower extremity  has a deformity with the foot deviated laterally. SKIN:  No rash or lesions. Skin is warm, dry, and intact.  LYMPHATIC:  No adenopathy.  NEUROLOGICAL:  Cranial nerves are intact. Sensation is intact throughout. No dysarthria or aphasia.  PSYCHIATRIC:  She  is alert and oriented, cooperative, not confused or agitated.   LABORATORY DATA:  White count is 7.5, hemoglobin 11.2, hematocrit 34, and platelets are 160,000. Sodium is 134, potassium 3.0, chloride 98, bicarbonate 31, BUN 20, creatinine 1.31, glucose 135.   RADIOLOGIC DATA:  Results are pending. Left hip fracture preliminarily seen on radiographic imaging.   ASSESSMENT AND PLAN: 1.  Left hip fracture. Orthopedics has agreed to see this patient later on this morning. In the meantime, hospitalists will admit her. We will keep her with good pain control and follow orthopedics' recommendations in terms of her left hip fracture.  2.  Heart murmur. Given the physical exam findings, this is presumed aortic stenosis. We will get an echocardiogram just to evaluate this murmur, especially as her fall was unwitnessed and seems mechanical, but just to insure that her heart murmur is not an aortic stenosis that may have contributed.  3.  Hypertension. This is a chronic stable problem. We will continue her home medications for this here in the hospital.  4.  Atrial fibrillation. Again, this is a chronic stable problem. We will continue her on amiodarone, which is her home medication for rate control.  5.  Coronary artery disease. We will continue her appropriate home medications for this.  6.  Hyperlipidemia. Continue her statin while here in the hospital.  7.  Deep vein thrombosis prophylaxis with subcutaneous heparin.   CODE STATUS:  This patient is DO NOT RESUSCITATE.   TIME SPENT ON THIS ADMISSION:  45 minutes.    ____________________________ Candace Cruise. Anne Hahn, MD dfw:[id number] D: 04/05/2015 04:15:57 ET T: 04/05/2015 06:03:03 ET JOB#: [id number]  cc: Candace Cruise. Anne Hahn, MD, <Dictator> Tylea Hise Scotty Court MD ELECTRONICALLY SIGNED 04/06/2015 0:54

## 2015-04-17 NOTE — Op Note (Signed)
PATIENT NAME:  Emily FilbertBOWLAND, Emily F MR#:  [id number] DATE OF BIRTH:  September 23, 1915  DATE OF PROCEDURE:  04/05/2015  PREOPERATIVE DIAGNOSIS:  Left comminuted, displaced intertrochanteric hip fracture.   POSTOPERATIVE DIAGNOSIS:  Left comminuted, displaced intertrochanteric hip fracture.   PROCEDURE:  Intramedullary fixation of left intertrochanteric hip fracture.   SURGEON:  Juanell FairlyKevin Prajwal Fellner, MD.   ANESTHESIA:  Spinal.   ESTIMATED BLOOD LOSS:  100 mL.   COMPLICATIONS:  None.   IMPLANTS:  [employer] Affixus 9 x 180 mm short intramedullary rod, a 95 x 10 mm lag screw,  and a 38 mm distal interlocking screw.   INDICATIONS FOR PROCEDURE:  The patient is a 81 year old female who fell at home.  She is ambulatory at baseline.  She fell returning to her bed after using the restroom.  She was unable to stand following this injury and was [facility] where x-rays revealed a comminuted displaced fracture of the left hip.  Given the patient's ambulatory status at baseline and for pain control, I recommended intramedullary fixation.  I reviewed the risks and benefits of the surgery with the patient and her family in the preoperative area.  They understood the risks include infection, bleeding, nerve or blood vessel injury, leg length discrepancy, change in lower extremity rotation, malunion, nonunion, persistent pain, and the need for further surgery, including conversion to a total hip arthroplasty.  Medical risks include but are not limited to DVT and pulmonary embolism, myocardial infarction, stroke, pneumonia, respiratory failure, and death.  They understood these risks and wished to proceed.  I signed the left hip with the word "yes" according to the hospital's correct site of surgery protocol.  I added my initials to the surgical site as well.   The patient had been cleared for surgery by the medical service.  She was brought to the operating room where she underwent a  spinal anesthetic by the anesthesia service.  She was placed supine on the fracture table.  Her left leg was placed in a leg holder in extension.  The right leg was placed in a hemi-lithotomy position.  All bony prominences were adequately padded.  The patient had traction and internal rotation applied to the left thigh.  Fracture reduction was adequate.  Fracture reduction was confirmed on AP and lateral C-arm images.  The patient was then prepped and draped in a sterile fashion.  A timeout was performed to verify the patient's name, date of birth, medical record number, correct site of surgery, and correct procedure to be performed.  It was also used to verify the patient received antibiotics and that all appropriate instruments, implants, and radiographic studies were available in the room.  Once all in attendance were in agreement, the case began.   An incision was made just superior to the hip in line with the femur.  The deep fascia was incised with a deep #10 blade.  The abductor muscle was split in line with its fibers which allowed for palpation of the tip of the greater trochanter.  A threaded drill pin was then inserted into the tip of the greater trochanter and advanced into the proximal femur to the level of the lesser trochanter. The position of the guide pin was confirmed on AP and lateral C-arm images.  It was then overdrilled with the proximal femoral drill.  This allowed for entry into the proximal femur of the intramedullary nail.  A 9 x 180 mm intramedullary rod was then inserted into  the proximal femur.  Its position was confirmed on AP and lateral C-arm images.  A drill guide for the lag screw was then placed through the guide arm of the Affixus nail.  A small stab incision allowed for placement of this drill sleeve along the lateral cortex of the femur.  A threaded drill pin again was advanced through the lateral femur, across the fracture site, and into the femoral head.  Its position again  was confirmed on AP and lateral C-arm images.  A depth gauge was used to measure the depth of the lag screw.  It was found to be 95 mm in length.  This was then overdrilled using a cannulated drill for the lag screw.  A 95 mm lag screw was then advanced into position by hand.  Compression was applied to the fracture as traction was gently released. The fracture compressed nicely.  A lag screw was then tightened in the proximal portion of the nail with a screwdriver.  It was locked into position.  The drill guide for the lag screw was then removed from the Affixus guide arm.  A second drill guide was placed through the guide arm to allow for drilling of the distal interlocking screw.  Again, a small stab incision allowed for placement of this drill guide along the lateral cortex of the femur.  A drill guide and was then advanced through this guide sleeve and drilled bicortically.  The lag screw was measured to be 38 mm in length.  A 38 mm lag screw was then advanced into position by hand and hand tightened.  The guide arm for the Affixus nail was then released.  Final images of the intramedullary construct were taken in both the AP and lateral planes at the hip and distally.  The entire construct was visualized.  A perfect circle technique was used to ensure the distal interlocking screw was placed through the intramedullary rod.  All incisions were copiously irrigated.  For the proximal incision, 0 Vicryl was used to close the deep fascia.  The subcutaneous tissue of the proximal incision was closed in two layers with 0 Vicryl and 2-0 Vicryl.  The subcutaneous tissues of the distal two incisions were closed with 2-0 Vicryl.  The skin was approximated with staples.  A dry sterile dressing was applied.  The patient was then transferred to a hospital bed and brought to the PACU in stable condition.  I was scrubbed and present for the entire case, and all sharp and instrument counts were correct at the conclusion of the  case.  I spoke with the patient's family in the postoperative consultation room to answer their questions and let them know the patient had done well, was stable in the recovery room, and the case had gone without complication.    ____________________________ Kathreen Devoid, MD [id number] D: 04/06/2015 20:14:39 ET T: 04/06/2015 20:27:47 ET JOB#: [id number]  cc: Kathreen Devoid, MD, <Dictator> Kathreen Devoid MD ELECTRONICALLY SIGNED 04/13/2015 13:23

## 2015-04-17 NOTE — Consult Note (Signed)
Brief Consult Note: Diagnosis: CAD s/p CABG.   Patient was seen by consultant.   Consult note dictated.   Recommend to proceed with surgery or procedure.   Comments: Pt is low-mod risk for L hip surgery, advise proceeding. EKG shows no acute changes and pt denies CP or SOB.  Electronic Signatures: Radene KneeKhan, Madeeha Costantino Ali (MD)  (Signed 19-Apr-16 08:58)  Authored: Brief Consult Note   Last Updated: 19-Apr-16 08:58 by Radene KneeKhan, Aiken Withem Ali (MD)

## 2015-04-17 NOTE — Consult Note (Addendum)
PATIENT NAME:  Emily Henry, Emily Henry MR#:  [id number] DATE OF BIRTH:  [birth year]  DATE OF CONSULTATION:  04/05/2015  REFERRING PHYSICIAN:   CONSULTING PHYSICIAN:  Laurier NancyShaukat A. Jahmia Berrett, MD  INDICATION FOR CONSULTATION: Left hip fracture.   HISTORY OF PRESENT ILLNESS: This is a 81 year old white female with a past medical history of moderate mitral regurgitation, moderate tricuspid regurgitation, who presented to the Emergency Room after falling down and left hip fracture. She denies any chest pain or shortness of breath. Occasionally she has dizziness and this has been worked up. She has a past medical history of having three-vessel coronary artery disease with left main 80% on cardiac catheterization done on 06/30/2003 and underwent bypass [facility] three-vessel in July 2004. Since then, she has done well. She specifically was seen 11/01/2014 in the office without any significant symptoms. She has occasional shortness of breath and pedal edema for which she has been getting Lasix.   PAST MEDICAL HISTORY: She had an echocardiogram done on 07/01/2014 which showed ejection fraction of 72%. There is moderate tricuspid regurgitation, moderate mitral regurgitation, mild pulmonic regurgitation, moderate pulmonary hypertension with right ventricular systolic pressure of 57.9 mmHg. She has been doing fine with followups. Right now she is also not having any significant symptoms. Her other past medical history is a history of hypertension, hyperlipidemia, osteoarthritis, history of atrial fibrillation.   HOME MEDICATIONS: Amiodarone 200 mg, aspirin 81 mg, enalapril 10 mg, furosemide 20 mg, pravastatin 20 mg.   PHYSICAL EXAMINATION: GENERAL: She is alert, oriented x 3, in no acute distress.  VITAL SIGNS: Stable.  NECK: No JVD.  LUNGS: Clear.  HEART: Regular rate and rhythm. Normal S1, S2. No audible murmur.  ABDOMEN: Soft, nontender, positive bowel sounds.  EXTREMITIES: Trace pedal edema.   NEUROLOGIC: She appears to be intact.   EKG showed normal sinus rhythm, 60 beats per minute, first-degree AV block, nonspecific ST-T changes.   ASSESSMENT AND PLAN: The patient has a history of mild pulmonic regurgitation, moderate tricuspid and mitral regurgitation with moderate pulmonary hypertension. Ejection fraction was 72% on echocardiogram done 07/01/2014. Carotid Doppler was done also for dizziness that was unremarkable also. Advise proceeding with surgery. The patient denies any chest pain or shortness of breath at this time. She does not need any further workup. Thank you very much for the referral.    ____________________________ Laurier NancyShaukat A. Hawken Bielby, MD sak:at D: 04/05/2015 09:32:44 ET T: 04/05/2015 10:03:03 ET JOB#: [id number]  cc: Laurier NancyShaukat A. Sharlotte Baka, MD, <Dictator> Laurier NancySHAUKAT A Carnetta Losada MD ELECTRONICALLY SIGNED 05/03/2015 15:18

## 2015-04-17 NOTE — Discharge Summary (Signed)
PATIENT NAME:  Emily Henry, Emily Henry MR#:  [id number] DATE OF BIRTH:  11-10-1915  DATE OF ADMISSION:  04/05/2015 DATE OF DISCHARGE:  04/08/2015  DISCHARGE DIAGNOSES: 1.   Left intratrochanteric hip fracture status post surgery.  2.  Hypertension.  3.  Blood loss anemia.  4.  Mild renal insufficiency.  MEDICATIONS ON DISCHARGE: 1.  Pravastatin 20 mg oral tablet once a day.  2.  Ferrous sulfate 325 oral 2 times a day.  3.  Latanoprost eyedrops once a day.  4.  Amiodarone 200 mg oral once a day.  5.  Enalapril 10 mg oral tablet 2 times a day.  6.  Furosemide 20 mg once a day.  7.  Aspirin 81 mg once a day.  8.  Docusate calcium 240 mg once a day.  9.  Calcium and vitamin D 500 mg/200 mg oral 2 times a day.   DIET ON DISCHARGE: Low-sodium, regular consistency diet.   TIMEFRAME FOR FOLLOWUP: Advised to follow in 1 to 2 weeks in orthopedic clinic.  HISTORY OF PRESENTING ILLNESS: A 81 year old female who had unwitnessed fall at home and after that had pain in the left hip. She was found to have a fracture on the left hip and so admitted to hospitalist team for further management.   HOSPITAL COURSE AND STAY:  1.  Left intertrochanteric hip fracture. Intramedullary fixation was done by Dr. Martha ClanKrasinski on 19th of April. Pain management and physical therapy was done and she remained stable. Cooperating with physical therapy minimally 2 to 3 days in hospital so discharged to rehab.  2.  Hypokalemia. Supplemented IV and it resolved.  3.  Heart murmur. Cardiac function was normal per Dr. Welton FlakesKhan so no further work-up was suggested before surgery. 4.  Essential hypertension. It was reasonably controlled in the hospital with enalapril.  5.  Renal insufficiency. Remained stable with IV fluid. 6.  Acute on chronic anemia. Due to blood loss secondary to surgery. It remained stable.   CONSULTATIONS IN HOSPITAL: Orthopedic by Dr. Juanell FairlyKevin Krasinski and cardiology by Dr. Adrian BlackwaterShaukat Khan.  IMPORTANT LABORATORY  RESULTS: WBC 7.5, hemoglobin 11.2, platelet count 160,000, MCV 96. Glucose 135, creatinine 1.31, sodium 134, potassium 3. Hemoglobin after surgery 8.3 and it remained 7.8 on the day of discharge.  TIME SPENT ON THIS DISCHARGE: 40 minutes.  ____________________________ Hope PigeonVaibhavkumar G. Elisabeth PigeonVachhani, MD vgv:sb D: 04/11/2015 22:50:28 ET T: 04/12/2015 09:39:10 ET JOB#: [id number]  cc: Hope PigeonVaibhavkumar G. Elisabeth PigeonVachhani, MD, <Dictator> Kathreen DevoidKevin L. Krasinski, MD Altamese DillingVAIBHAVKUMAR Yesly Gerety MD ELECTRONICALLY SIGNED 04/12/2015 10:39

## 2015-04-19 DIAGNOSIS — R609 Edema, unspecified: Secondary | ICD-10-CM | POA: Diagnosis not present

## 2015-04-25 DIAGNOSIS — I890 Lymphedema, not elsewhere classified: Secondary | ICD-10-CM | POA: Diagnosis not present

## 2015-05-02 DIAGNOSIS — I5031 Acute diastolic (congestive) heart failure: Secondary | ICD-10-CM | POA: Diagnosis not present

## 2015-05-04 ENCOUNTER — Telehealth: Payer: Self-pay | Admitting: Internal Medicine

## 2015-05-04 NOTE — Telephone Encounter (Signed)
This issue has come up before She is not recovering well from the hip fracture and it looks like she will need to stay in SNF Will start sertraline 25 daily [facility] She will notify son

## 2015-05-04 NOTE — Telephone Encounter (Signed)
Mellody DanceKeith called stating his mother is depressed.  She is having a hard time being at [facility].  He wanted to know if you could give her something mild for her depression.

## 2015-05-09 IMAGING — CR DG CHEST 1V
1 series · 1 of 1 positions shown · non-contrast
Comparison: 02/26/2015.

CLINICAL DATA: Fall.  Cough.

EXAM:
CHEST  1 VIEW

[dxr chest 1 viewap or pa]
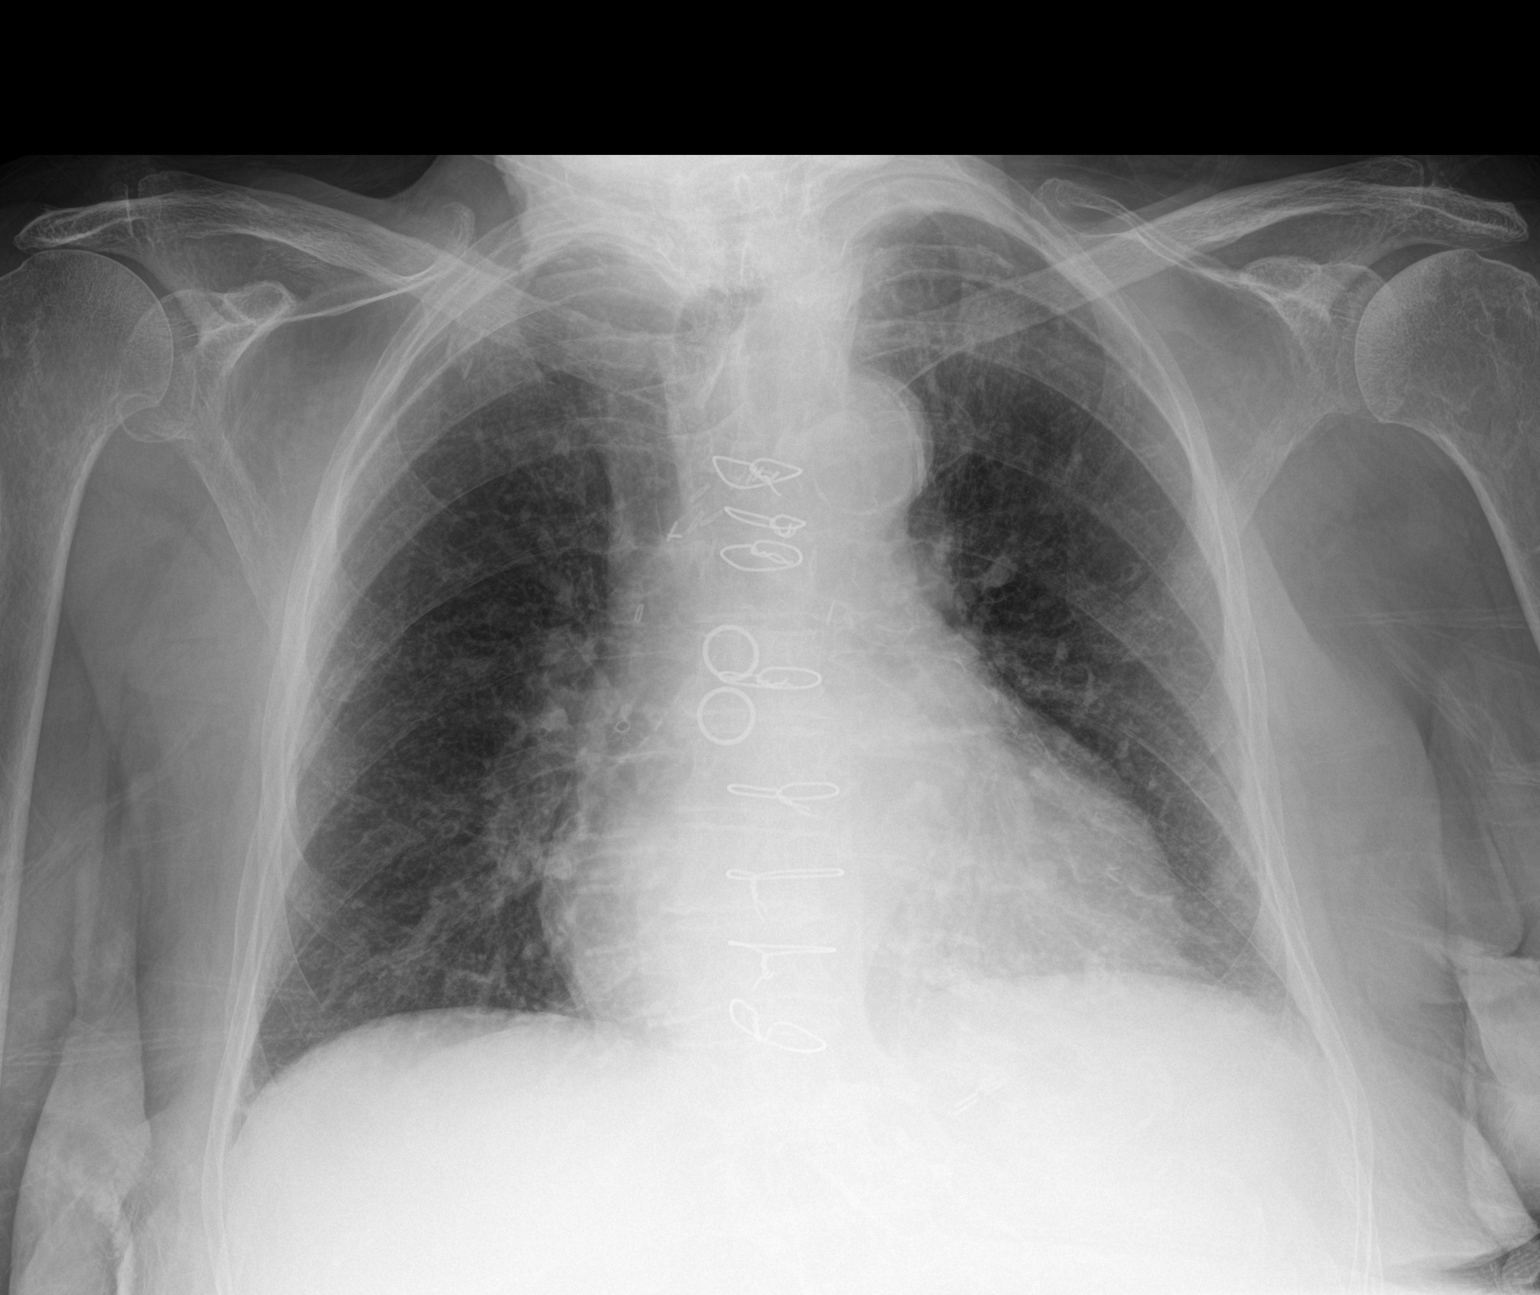

[1 of 1 positions shown; findings below may reference images not displayed]

FINDINGS: Mediastinum hilar structures are unremarkable. Prior CABG.
Cardiomegaly with normal pulmonary vascularity. No focal infiltrate.
Mild basilar subsegmental atelectasis. No pleural effusion or
pneumothorax. Diffuse osteopenia.
IMPRESSION: 1. Prior CABG.  Cardiomegaly.  No CHF.
2. Mild basilar subsegmental atelectasis.

## 2015-05-09 IMAGING — CR DG HIP COMPLETE 2+V*L*
1 series · 3 of 3 positions shown · non-contrast
Comparison: CT in 9474.

CLINICAL DATA: Fall.  Pain.  Initial evaluation.

EXAM:
LEFT HIP (WITH PELVIS) 2-3 VIEWS

[Series 1: dxr hip left complete · 0.14mm/px · 3 of 3 slices shown]
[im 1/3]
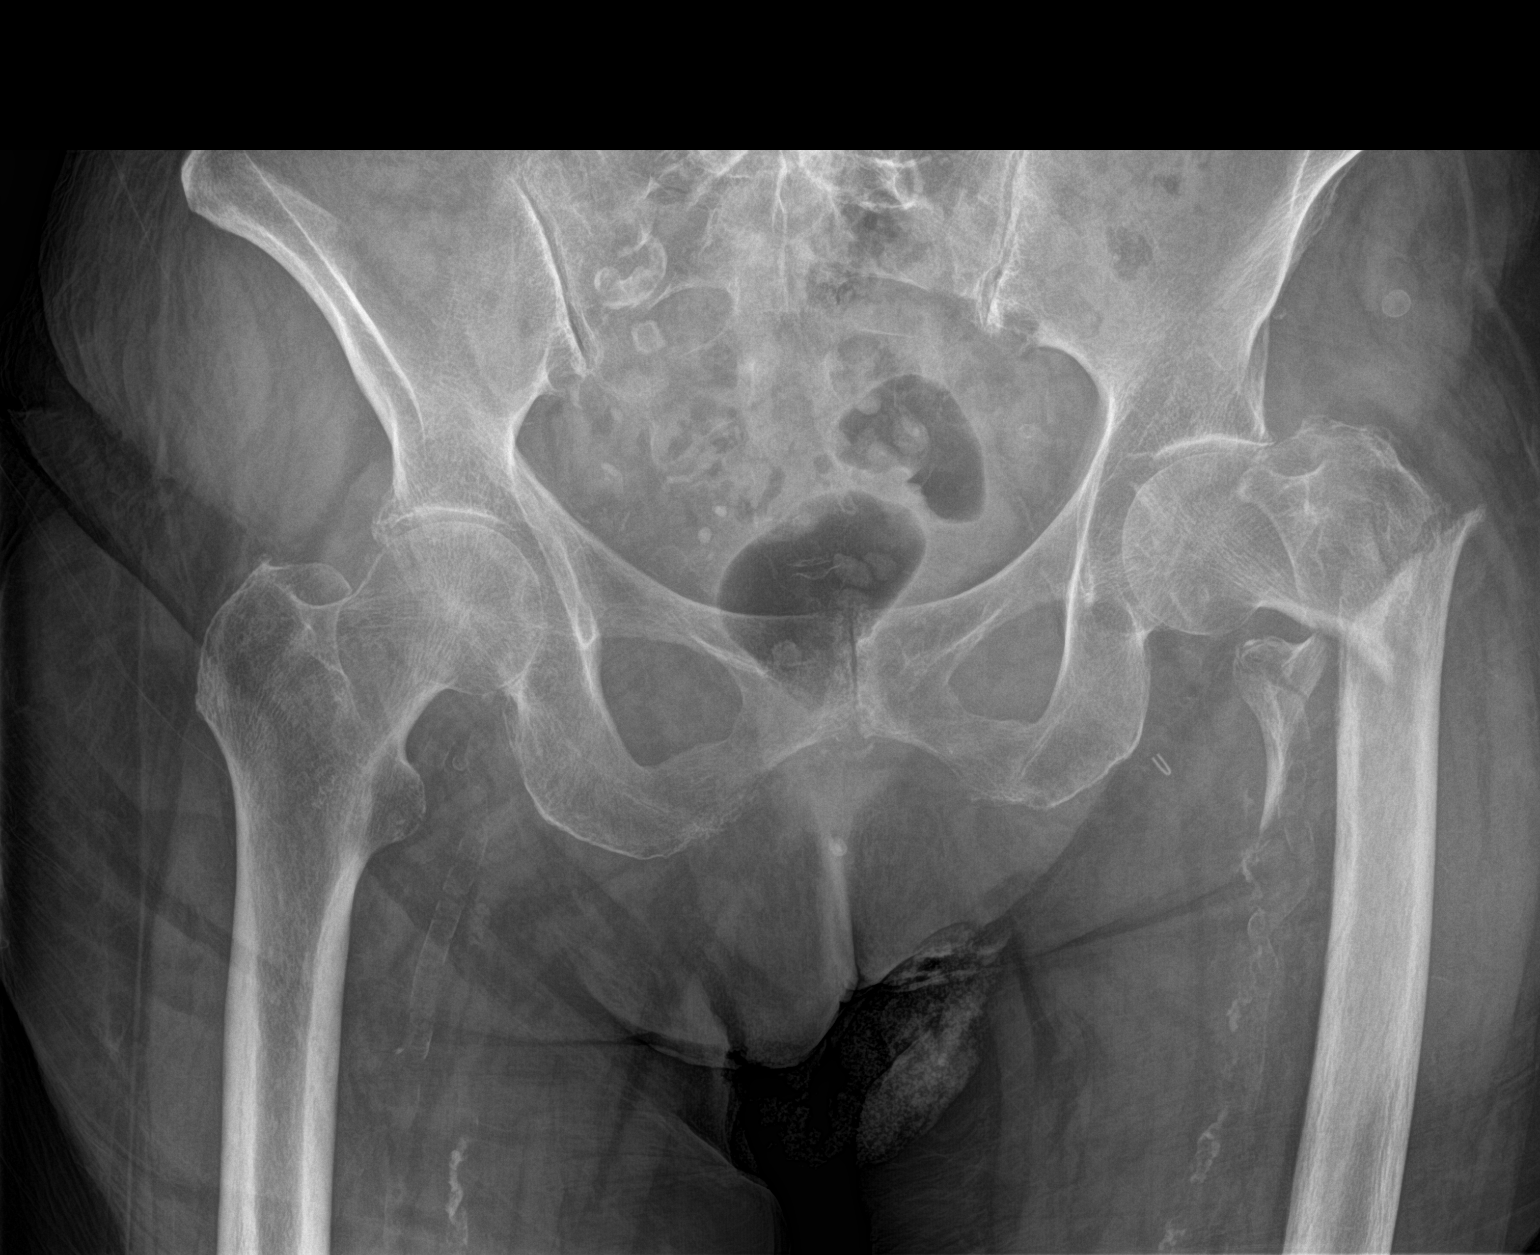
[im 2/3]
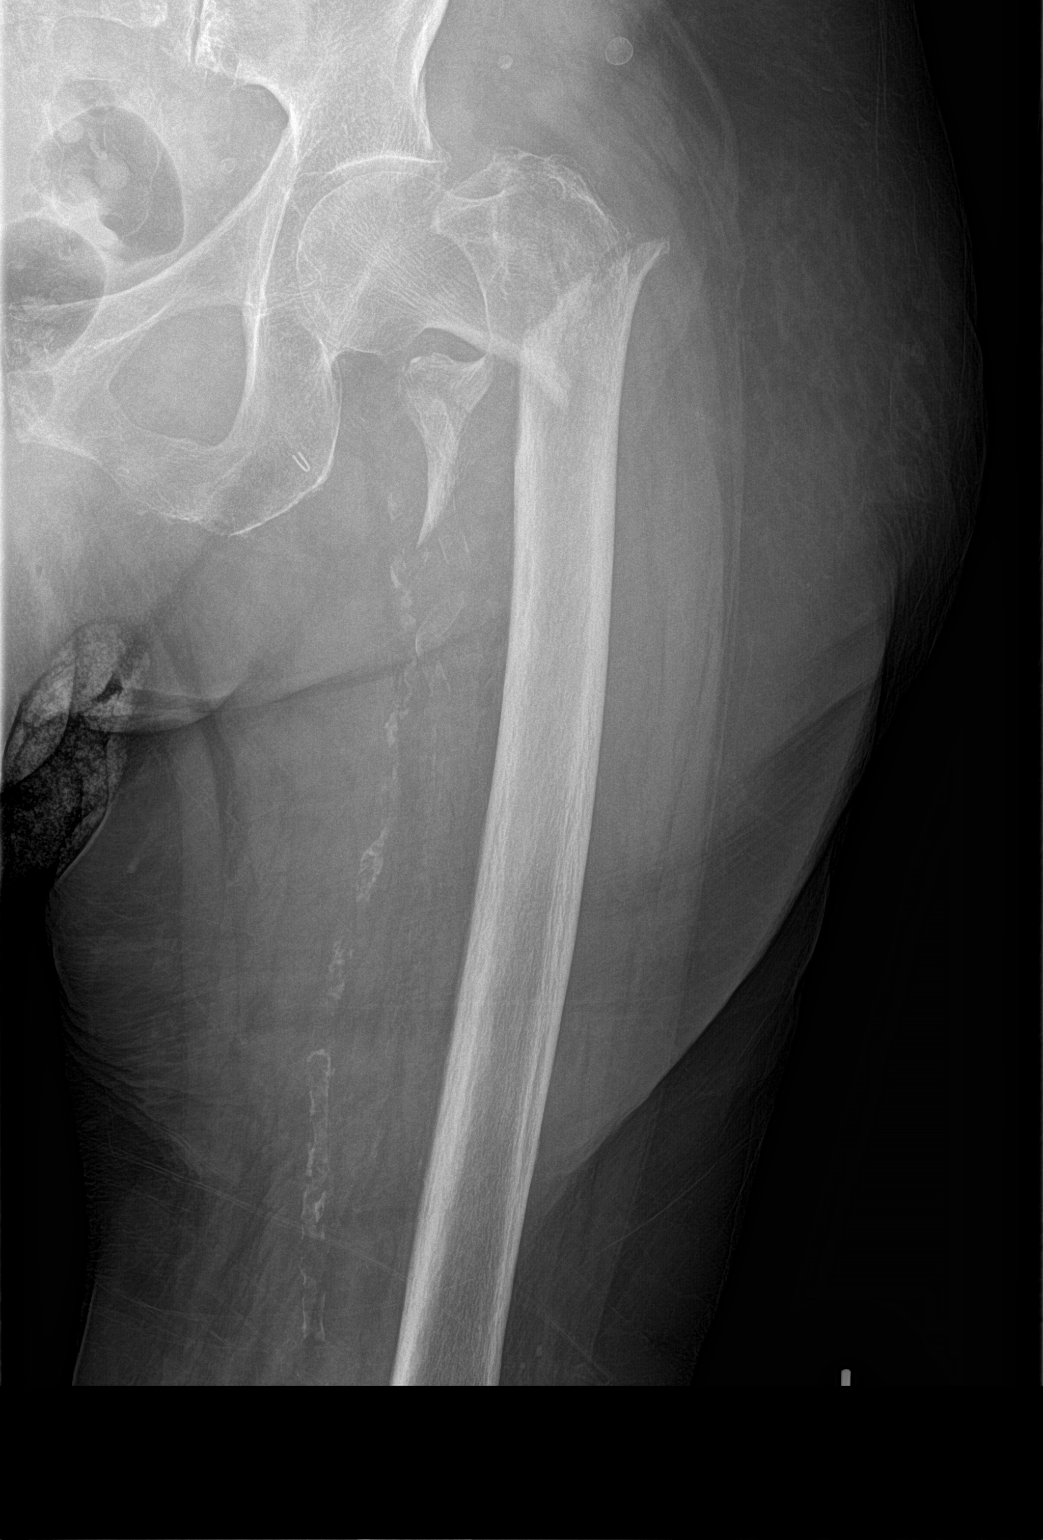
[im 3/3]
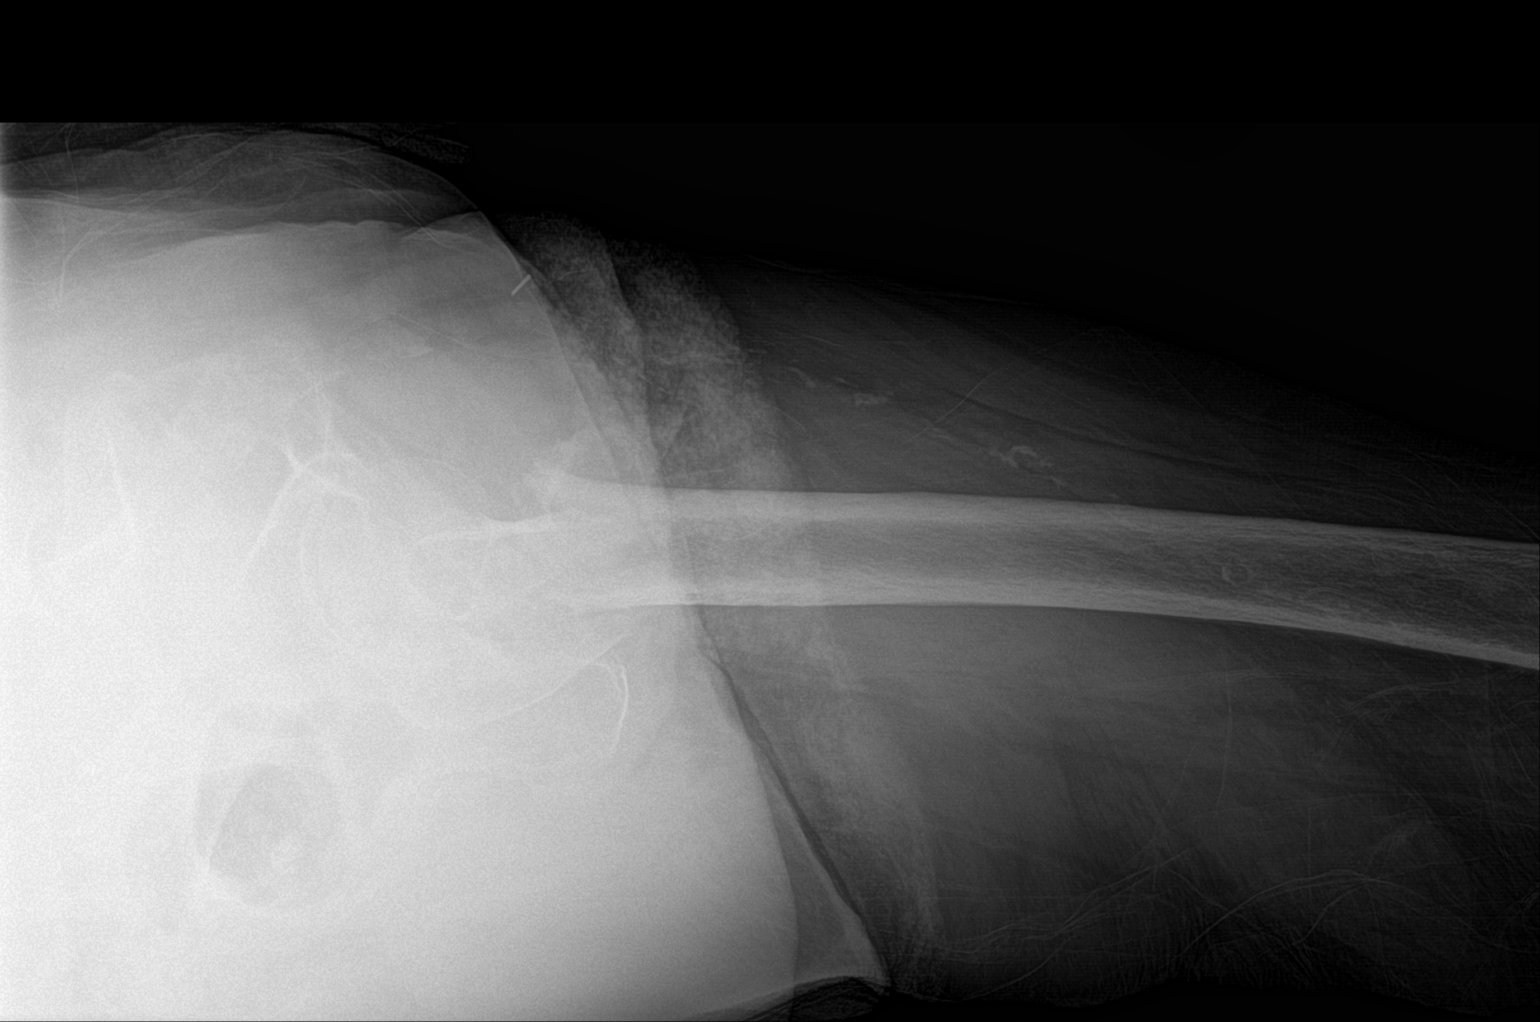

[3 of 3 positions shown; findings below may reference images not displayed]

FINDINGS: Comminuted subtrochanteric hip fracture is present with severe
angulation deformity. Displaced lesser trochanter fracture is
present. Degenerative changes lumbar spine and both hips. Diffuse
osteopenia . Scratched Vascular calcification noted. Surgical clip
noted over the left groin. Calcified injection granulomas noted.
IMPRESSION: Comminuted, displaced, angulated left intertrochanteric hip
fracture. Displaced lesser trochanter fracture is present.

## 2015-05-14 DIAGNOSIS — M79605 Pain in left leg: Secondary | ICD-10-CM | POA: Diagnosis not present

## 2015-05-17 DIAGNOSIS — H1010 Acute atopic conjunctivitis, unspecified eye: Secondary | ICD-10-CM | POA: Diagnosis not present

## 2015-06-01 DIAGNOSIS — S72012A Unspecified intracapsular fracture of left femur, initial encounter for closed fracture: Secondary | ICD-10-CM

## 2015-06-01 DIAGNOSIS — I2510 Atherosclerotic heart disease of native coronary artery without angina pectoris: Secondary | ICD-10-CM | POA: Diagnosis not present

## 2015-06-01 DIAGNOSIS — I270 Primary pulmonary hypertension: Secondary | ICD-10-CM

## 2015-06-01 DIAGNOSIS — K219 Gastro-esophageal reflux disease without esophagitis: Secondary | ICD-10-CM | POA: Diagnosis not present

## 2015-06-01 DIAGNOSIS — F4323 Adjustment disorder with mixed anxiety and depressed mood: Secondary | ICD-10-CM

## 2015-06-01 DIAGNOSIS — I10 Essential (primary) hypertension: Secondary | ICD-10-CM | POA: Diagnosis not present

## 2015-06-01 DIAGNOSIS — I4891 Unspecified atrial fibrillation: Secondary | ICD-10-CM | POA: Diagnosis not present

## 2015-06-09 DIAGNOSIS — R112 Nausea with vomiting, unspecified: Secondary | ICD-10-CM | POA: Diagnosis not present

## 2015-06-09 DIAGNOSIS — R531 Weakness: Secondary | ICD-10-CM | POA: Diagnosis not present

## 2015-06-09 DIAGNOSIS — N390 Urinary tract infection, site not specified: Secondary | ICD-10-CM | POA: Diagnosis not present

## 2015-06-10 ENCOUNTER — Encounter: Payer: Self-pay | Admitting: *Deleted

## 2015-06-10 ENCOUNTER — Inpatient Hospital Stay
Admission: EM | Admit: 2015-06-10 | Discharge: 2015-06-17 | DRG: 690 | Disposition: E | Payer: Medicare Other | Attending: Internal Medicine | Admitting: Internal Medicine

## 2015-06-10 ENCOUNTER — Other Ambulatory Visit: Payer: Self-pay

## 2015-06-10 DIAGNOSIS — Z8249 Family history of ischemic heart disease and other diseases of the circulatory system: Secondary | ICD-10-CM | POA: Diagnosis not present

## 2015-06-10 DIAGNOSIS — Z9889 Other specified postprocedural states: Secondary | ICD-10-CM | POA: Diagnosis not present

## 2015-06-10 DIAGNOSIS — Z951 Presence of aortocoronary bypass graft: Secondary | ICD-10-CM

## 2015-06-10 DIAGNOSIS — S72002D Fracture of unspecified part of neck of left femur, subsequent encounter for closed fracture with routine healing: Secondary | ICD-10-CM

## 2015-06-10 DIAGNOSIS — J690 Pneumonitis due to inhalation of food and vomit: Secondary | ICD-10-CM

## 2015-06-10 DIAGNOSIS — D649 Anemia, unspecified: Secondary | ICD-10-CM | POA: Diagnosis present

## 2015-06-10 DIAGNOSIS — R630 Anorexia: Secondary | ICD-10-CM

## 2015-06-10 DIAGNOSIS — M1990 Unspecified osteoarthritis, unspecified site: Secondary | ICD-10-CM | POA: Diagnosis present

## 2015-06-10 DIAGNOSIS — E785 Hyperlipidemia, unspecified: Secondary | ICD-10-CM | POA: Diagnosis present

## 2015-06-10 DIAGNOSIS — I10 Essential (primary) hypertension: Secondary | ICD-10-CM | POA: Diagnosis present

## 2015-06-10 DIAGNOSIS — I2510 Atherosclerotic heart disease of native coronary artery without angina pectoris: Secondary | ICD-10-CM | POA: Diagnosis present

## 2015-06-10 DIAGNOSIS — Z515 Encounter for palliative care: Secondary | ICD-10-CM | POA: Diagnosis not present

## 2015-06-10 DIAGNOSIS — N390 Urinary tract infection, site not specified: Principal | ICD-10-CM | POA: Diagnosis present

## 2015-06-10 DIAGNOSIS — R4181 Age-related cognitive decline: Secondary | ICD-10-CM | POA: Diagnosis present

## 2015-06-10 DIAGNOSIS — I4891 Unspecified atrial fibrillation: Secondary | ICD-10-CM | POA: Diagnosis present

## 2015-06-10 DIAGNOSIS — E860 Dehydration: Secondary | ICD-10-CM | POA: Diagnosis present

## 2015-06-10 DIAGNOSIS — R4182 Altered mental status, unspecified: Secondary | ICD-10-CM | POA: Diagnosis present

## 2015-06-10 DIAGNOSIS — J9601 Acute respiratory failure with hypoxia: Secondary | ICD-10-CM

## 2015-06-10 DIAGNOSIS — R400 Somnolence: Secondary | ICD-10-CM

## 2015-06-10 DIAGNOSIS — K589 Irritable bowel syndrome without diarrhea: Secondary | ICD-10-CM | POA: Diagnosis present

## 2015-06-10 DIAGNOSIS — Z66 Do not resuscitate: Secondary | ICD-10-CM | POA: Diagnosis present

## 2015-06-10 DIAGNOSIS — R5383 Other fatigue: Secondary | ICD-10-CM

## 2015-06-10 HISTORY — DX: Unspecified atrial fibrillation: I48.91

## 2015-06-10 HISTORY — DX: Essential (primary) hypertension: I10

## 2015-06-10 MED ORDER — SODIUM CHLORIDE 0.9 % IV SOLN: 5.0000 mg/h | INTRAVENOUS | Status: DC

## 2015-06-10 MED ORDER — MORPHINE SULFATE (CONCENTRATE) 10 MG/0.5ML PO SOLN: 5.0000 mg | ORAL | Status: DC | PRN

## 2015-06-10 MED ORDER — SODIUM CHLORIDE 0.9 % IJ SOLN: 3.0000 mL | INTRAMUSCULAR | Status: DC | PRN

## 2015-06-10 MED ORDER — PROCHLORPERAZINE MALEATE 5 MG PO TABS
5.0000 mg | ORAL_TABLET | Freq: Four times a day (QID) | ORAL | Status: DC | PRN
  Filled 2015-06-10: qty 1

## 2015-06-10 MED ORDER — PROCHLORPERAZINE EDISYLATE 5 MG/ML IJ SOLN: 10.0000 mg | Freq: Four times a day (QID) | INTRAMUSCULAR | Status: DC | PRN

## 2015-06-10 MED ORDER — PROCHLORPERAZINE 25 MG RE SUPP: 25.0000 mg | Freq: Two times a day (BID) | RECTAL | Status: DC | PRN

## 2015-06-10 MED ORDER — SODIUM CHLORIDE 0.9 % IV SOLN: 250.0000 mL | INTRAVENOUS | Status: DC | PRN

## 2015-06-10 MED ORDER — HALOPERIDOL 0.5 MG PO TABS: 0.5000 mg | ORAL_TABLET | ORAL | Status: DC | PRN

## 2015-06-10 MED ORDER — LORAZEPAM 1 MG PO TABS: 1.0000 mg | ORAL_TABLET | ORAL | Status: DC | PRN

## 2015-06-10 MED ORDER — HALOPERIDOL LACTATE 2 MG/ML PO CONC
0.5000 mg | ORAL | Status: DC | PRN
  Filled 2015-06-10: qty 0.3

## 2015-06-10 MED ORDER — LORAZEPAM 2 MG/ML IJ SOLN: 1.0000 mg | INTRAMUSCULAR | Status: DC | PRN

## 2015-06-10 MED ORDER — LORAZEPAM 2 MG/ML PO CONC: 1.0000 mg | ORAL | Status: DC | PRN

## 2015-06-10 MED ORDER — MORPHINE 100MG IN NS 100ML (1MG/ML) PREMIX INFUSION
5.0000 mg/h | INTRAVENOUS | Status: DC
  Administered 2015-06-10: 5 mg/h via INTRAVENOUS
  Filled 2015-06-10: qty 100

## 2015-06-10 MED ORDER — SODIUM CHLORIDE 0.9 % IJ SOLN: 3.0000 mL | Freq: Two times a day (BID) | INTRAMUSCULAR | Status: DC

## 2015-06-10 MED ORDER — HALOPERIDOL LACTATE 5 MG/ML IJ SOLN: 0.5000 mg | INTRAMUSCULAR | Status: DC | PRN

## 2015-06-17 NOTE — Progress Notes (Signed)
Pt passed at 1444. MD notified. Supervisor notified. Visiting family has left at this time. Post mortem care done.

## 2015-06-17 NOTE — ED Provider Notes (Signed)
[facility] Emergency Department Provider Note  ____________________________________________  Time seen: On arrival, via EMS  I have reviewed the triage vital signs and the nursing notes.   HISTORY  Chief Complaint Altered Mental Status   Patient in severe distress, history significantly limited, history per EMS   HPI Emily Henry is a 81 y.o. female who presents with altered mental status and respiratory distress. Patient reportedly being treated for UTI is a resident at [facility] nursing home today with significantly altered mental status. Power of attorney is here, patient is a DO NOT RESUSCITATE area. Discussed with family and they agreed to withhold any additional treatment or care or diagnostics. They prefer comfort care only.     Past Medical History  Diagnosis Date  . Hypertension   . Atrial fibrillation     There are no active problems to display for this patient.   Past Surgical History  Procedure Laterality Date  . Femur closed reduction      No current outpatient prescriptions on file.  Allergies Review of patient's allergies indicates not on file.  No family history on file.  Social History History  Substance Use Topics  . Smoking status: Never Smoker   . Smokeless tobacco: Not on file  . Alcohol Use: Not on file    Review of Systems  Level V caveat: Review of systems Limited secondary to severe distress ____________________________________________   PHYSICAL EXAM:  VITAL SIGNS: ED Triage Vitals  Enc Vitals Group     BP 28-Jun-2015 1211 94/31 mmHg     Pulse Rate 2015-06-28 1211 68     Resp 06-28-2015 1211 22     Temp 06/28/2015 1211 97.6 F (36.4 C)     Temp Source 2015/06/28 1211 Axillary     SpO2 06/28/15 1211 100 %     Weight --      Height --      Head Cir --      Peak Flow --      Pain Score --      Pain Loc --      Pain Edu? --      Excl. in GC? --      Constitutional: Unresponsive. Critically ill  appearing  ENT   Head: Normocephalic and atraumatic.   Mouth/Throat: Mucous membranes are dry Cardiovascular: Bradycardia, regular rhythm. Normal and symmetric distal pulses are present in all extremities. No murmurs, rubs, or gallops. Respiratory: Rales diffusely Gastrointestinal: Soft and non-tender in all quadrants. No distention.  Genitourinary: deferred Musculoskeletal: No extremity injuries noted Neurologic:  Unable to evaluate due to altered mental status Skin:  Skin is warm, dry and intact. No rash noted. Psychiatric: pt with significant altered mental status, unable to evaluate  ____________________________________________    LABS (pertinent positives/negatives)  Labs Reviewed - No data to display  ____________________________________________   EKG  ED ECG REPORT I, Jene Every, the attending physician, personally viewed and interpreted this ECG.   Date: 06/28/2015  EKG Time: 12:04 PM  Rate: 72  Rhythm: normal sinus rhythm  Axis: Normal axis  Intervals:Incomplete right bundle-branch block  ST&T Change: Nonspecific   ____________________________________________    RADIOLOGY  None  ____________________________________________   PROCEDURES  Procedure(s) performed: none  Critical Care performed: none  ____________________________________________   INITIAL IMPRESSION / ASSESSMENT AND PLAN / ED COURSE  Pertinent labs & imaging results that were available during my care of the patient were reviewed by me and considered in my medical decision making (see  chart for details).  Palliative care consult. Dr. Donnald Garre came to the ED to see the patient and speak with family, they agree with comfort care only and no additional diagnostics or treatment. I ordered morphine drip for comfort and oxygen to be removed  ____________________________________________   FINAL CLINICAL IMPRESSION(S) / ED DIAGNOSES  Final diagnoses:  Acute respiratory  failure with hypoxia  Aspiration pneumonia, unspecified aspiration pneumonia type     Jene Every, MD Jun 16, 2015 1246

## 2015-06-17 NOTE — Discharge Summary (Signed)
Date of admission- 2015/07/10 Date of death- Jul 10, 2015  Cause of death- Old age Other contributing factors- UTI, Recent Hip fracture  HISTORY OF PRESENT ILLNESS: Emily Henry is a 81 y.o. female with a known history of CAD, HTN, a.fib, recent hip fx- in April 2016- was in rehab since then. For last 4 days - she is more drowsy and not eating- she is completely altered mental state now- and as per son- who is present in room- rehab nurse checked her UA- she had infection- so sent her to ER. In ER, family had a meeting with palliative care- they agreed on comfort care- and hospice home admission, her BP is running on lower side- so being unstable to transfer to hospice home- she is being admitted to hospital.  HOSPITAL COURSE  after admitted for comfort care- she died in few hours.

## 2015-06-17 NOTE — ED Notes (Signed)
Resident of [facility], recent uti, today decreased loc, family at bedside, DNR, dr Cyril Loosen has talked with family

## 2015-06-17 NOTE — ED Notes (Signed)
Adm md said no foley

## 2015-06-17 NOTE — Consult Note (Signed)
Palliative Medicine Inpatient Consult Note   Name: Emily Henry Date: 06/22/2015 MRN: [id number]  DOB: 27-May-1915  Referring Physician: Lavonia Drafts, MD  Palliative Care consult requested for this 81 y.o. female for goals of medical therapy in patient with CAD, HTN, a.fib, recent hip fx, admitted with altered mental status  Emily Henry is a 81 yo woman with PMH of HTN, hyperlipidemia, CAD s/p CABG, chronic anemia, IBS, OA, L.hip fx s/p ORIF 2015/06/22. She was brought to the ER today from SNF after 2 days altered mental status with increasing lethargy and decreased po. At present, pt is lying on stretcher in the ER. Does not respond to verbal stimuli. Family at bedside.    REVIEW OF SYSTEMS:  Patient is not able to provide ROS  SPIRITUAL SUPPORT SYSTEM: Yes.  SOCIAL HISTORY: Pt was living at home prior to hip fx 03/2015. Since that time she has been at [facility]. She is widowed. She has 2 sons and a daughter.   reports that she has never smoked. She does not have any smokeless tobacco history on file.  CODE STATUS: DNR  PAST MEDICAL HISTORY: Past Medical History  Diagnosis Date  . Hypertension   . Atrial fibrillation     PAST SURGICAL HISTORY:  Past Surgical History  Procedure Laterality Date  . Femur closed reduction      ALLERGIES:  has no allergies on file.  MEDICATIONS:  Current Facility-Administered Medications  Medication Dose Route Frequency Provider Last Rate Last Dose  . 0.9 %  sodium chloride infusion  250 mL Intravenous PRN [place] Jack Ijanae Macapagal, MD      . haloperidol (HALDOL) tablet 0.5 mg  0.5 mg Oral Q4H PRN [place] Jack Haja Crego, MD       Or  . haloperidol (HALDOL) 2 MG/ML solution 0.5 mg  0.5 mg Sublingual Q4H PRN Gray[place] Jack Keiton Cosma, MD       Or  . haloperidol lactate (HALDOL) injection 0.5 mg  0.5 mg Intravenous Q4H PRN [place] Jack Merl Guardino, MD      . LORazepam (ATIVAN) tablet 1 mg  1 mg Oral Q1H PRN [place] Jack Dena Esperanza, MD       Or  . LORazepam (ATIVAN) 2 MG/ML  concentrated solution 1 mg  1 mg Sublingual Q4H PRN [place] Jack Yailine Ballard, MD       Or  . LORazepam (ATIVAN) injection 1 mg  1 mg Intravenous Q1H PRN Gray[place] Jack Brysyn Brandenberger, MD      . morphine 139m in NS 1047m(30m230mL) infusion - premix  5 mg/hr Intravenous Continuous RobLavonia DraftsD      . morphine CONCENTRATE 10 MG/0.5ML oral solution 5 mg  5 mg Oral Q2H PRN NanGrayland Jackifer, MD       Or  . morphine CONCENTRATE 10 MG/0.5ML oral solution 5 mg  5 mg Sublingual Q2H PRN NanGrayland Jackifer, MD      . prochlorperazine (COMPAZINE) tablet 5 mg  5 mg Oral Q6H PRN NanGrayland Jackifer, MD       Or  . prochlorperazine (COMPAZINE) suppository 25 mg  25 mg Rectal Q12H PRN NanGrayland Jackifer, MD       Or  . prochlorperazine (COMPAZINE) injection 10 mg  10 mg Intravenous Q6H PRN NanGrayland Jackifer, MD      . sodium chloride 0.9 % injection 3 mL  3 mL Intravenous Q12H NanGrayland Jackifer, MD      . sodium chloride 0.9 % injection 3 mL  3 mL Intravenous PRN Grayland Jack Adelaide Pfefferkorn, MD       No current outpatient prescriptions on file.    Vital Signs: BP 94/31 mmHg  Pulse 68  Temp(Src) 97.6 F (36.4 C) (Axillary)  Resp 22  SpO2 92% There were no vitals filed for this visit.  There is no height or weight on file to calculate BMI.   PHYSICAL EXAM: General: elderly, critically ill-appearing HEENT: OP clear, moist oral mucosa Neck: Trachea midline  Cardiovascular: regular rhythm, bradycardic Pulmonary/Chest: coarse BS's ant fields Abdominal: Soft, nontender, hypoactive bowel sounds GU: No SP tenderness Extremities: 3+ edema  Neurological: no reponse to verbal stimuli, withdraws to noxious stimuli Skin: venous stasis changes B.LE's Psychiatric: unable to assess  LABS: CBC: No results for input(s): WBC, HGB, HCT, PLT in the last 168 hours. Comprehensive Metabolic Panel: No results for input(s): NA, K, CL, CO2, GLUCOSE, BUN, CREATININE, CALCIUM, MG, AST, ALT, ALKPHOS, BILITOT in the last 168 hours.  Invalid input(s):  GFRCGP  IMPRESSION: Emily Henry is a 81 yo woman with PMH of HTN, hyperlipidemia, CAD s/p CABG, chronic anemia, IBS, OA, L.hip fx s/p ORIF June 19, 2015. She was brought to the ER today from SNF after 2 days altered mental status with increasing lethargy and decreased po.   I met with pt's daughter and 2 sons. They recognize that pt is likely approaching the end of life. They confirm that pt is a DNR. We discussed admission for comfort care only and they are in agreement with that. Orders entered. Pt too unstable to transfer to [place].   PLAN: 1. DNR 2. Comfort care  REFERRALS TO BE ORDERED:  Chaplain   More than 50% of the visit was spent in counseling/coordination of care: YES  Time spent: 60 minutes

## 2015-06-17 NOTE — H&P (Signed)
[facility] [place] [facility]   PATIENT NAME: Emily Henry    MR#:  [id number]  DATE OF BIRTH:  [birth year]  DATE OF ADMISSION:  Jul 05, 2015  PRIMARY CARE PHYSICIAN: No primary care provider on file.   REQUESTING/REFERRING PHYSICIAN: Dr. Cyril Loosen  CHIEF COMPLAINT:   Chief Complaint  Patient presents with  . Altered Mental Status    HISTORY OF PRESENT ILLNESS: Kaaren Mead  is a 81 y.o. female with a known history of CAD, HTN, a.fib, recent hip fx- in April 2016- was in rehab since then. For last 4 days - she is more drowsy and not eating- she is completely altered mental state now- and as per son- who is present in room- rehab nurse checked her UA- she had infection- so sent her to ER. In ER, family had a meeting with palliative care- they agreed on comfort care- and hospice home admission, her BP is running on lower side- so being unstable to transfer to hospice home- she is being admitted to hospital.  PAST MEDICAL HISTORY:   Past Medical History  Diagnosis Date  . Hypertension   . Atrial fibrillation     PAST SURGICAL HISTORY:  Past Surgical History  Procedure Laterality Date  . Femur closed reduction      SOCIAL HISTORY:  History  Substance Use Topics  . Smoking status: Never Smoker   . Smokeless tobacco: Not on file  . Alcohol Use: Not on file    FAMILY HISTORY:Hypertension, kidney disease, congestive heart failure  DRUG ALLERGIES: No known drug allergy.  REVIEW OF SYSTEMS:   Pt is drowsy and not arousable now- so not obtained.  MEDICATIONS AT HOME:  Prior to Admission medications   Not on File    Need to be reviewed by pharmacy.  PHYSICAL EXAMINATION:   VITAL SIGNS: Blood pressure 94/31, pulse 68, temperature 97.6 F (36.4 C), temperature source Axillary, resp. rate 22, SpO2 92 %.  GENERAL:  81 y.o.-year-old patient lying in the bed .  EYES: Pupils equal, round, reactive to light. No scleral icterus.  HEENT: Head  atraumatic, normocephalic. Mucosa dry. NECK:  Supple, no jugular venous distention.  LUNGS: Normal breath sounds bilaterally,shallow breaths. No use of accessory muscles of respiration.  CARDIOVASCULAR: S1, S2 normal.  ABDOMEN: Soft, nondistended. Bowel sounds present.  EXTREMITIES: edema present, echymosis on both legs. NEUROLOGIC: pt is drowsy, and barely responsive to stimuli. PSYCHIATRIC: not able to check.  LABORATORY PANEL:   No labs checked today as family agreed on comfort care.  IMPRESSION AND PLAN:  * Altered mental state   Due to likely UTI( as reported by family checked at rehab)   Also dehydration and old age playing a role.   Comfort care is plan. On morphin drip.  * Htn, A fib, CABG   Pt on comfort care- so no meds for these.  All the records are reviewed and case discussed with ED provider. Management plans discussed with the patient, family and they are in agreement.  CODE STATUS:    Code Status Orders        Start     Ordered   07-05-15 1235  Do not attempt resuscitation (DNR)   Continuous    Question Answer Comment  In the event of cardiac or respiratory ARREST Do not call a "code blue"   In the event of cardiac or respiratory ARREST Do not perform Intubation, CPR, defibrillation or ACLS   In the event of cardiac or respiratory  ARREST Use medication by any route, position, wound care, and other measures to relive pain and suffering. May use oxygen, suction and manual treatment of airway obstruction as needed for comfort.      2015/06/27 1238       TOTAL TIME TAKING CARE OF THIS PATIENT: 30 mins.    Altamese Dilling M.D on June 27, 2015   Between 7am to 6pm - Pager - [phone]  After 6pm go to [email or web address] - password [email or web address] [facility]  [place] [facility]  Office  [phone]  CC: Primary care physician; No primary care provider on file.

## 2015-06-17 NOTE — Progress Notes (Signed)
   2015-07-04 1200  Clinical Encounter Type  Visited With Patient and family together  Visit Type Spiritual support;ED;Initial  Referral From Nurse  Consult/Referral To Chaplain  Spiritual Encounters  Spiritual Needs Emotional;Prayer;Grief support  Stress Factors  Patient Stress Factors Health changes  Family Stress Factors Health changes    Chaplain engaged patient's family. Chaplain provided therapeutic presence, empathic listening and emotional support.  AD [phone]

## 2015-06-17 NOTE — ED Notes (Signed)
Prime md with pt and family

## 2015-06-17 NOTE — ED Notes (Signed)
pallative care md at bedside

## 2015-06-17 DEATH — deceased
